# Patient Record
Sex: Female | Born: 1935 | Race: White | Hispanic: No | Smoking: Former smoker
Health system: Southern US, Community
[De-identification: ages and names within clinical notes are randomized; demographics above are authoritative.]

## PROBLEM LIST (undated history)

## (undated) DIAGNOSIS — M79609 Pain in unspecified limb: Secondary | ICD-10-CM

## (undated) DIAGNOSIS — J45909 Unspecified asthma, uncomplicated: Secondary | ICD-10-CM

## (undated) DIAGNOSIS — Z972 Presence of dental prosthetic device (complete) (partial): Secondary | ICD-10-CM

## (undated) DIAGNOSIS — M1990 Unspecified osteoarthritis, unspecified site: Secondary | ICD-10-CM

## (undated) DIAGNOSIS — R0600 Dyspnea, unspecified: Secondary | ICD-10-CM

## (undated) DIAGNOSIS — J309 Allergic rhinitis, unspecified: Secondary | ICD-10-CM

## (undated) DIAGNOSIS — S68119A Complete traumatic metacarpophalangeal amputation of unspecified finger, initial encounter: Secondary | ICD-10-CM

## (undated) DIAGNOSIS — B029 Zoster without complications: Secondary | ICD-10-CM

## (undated) DIAGNOSIS — R351 Nocturia: Secondary | ICD-10-CM

## (undated) DIAGNOSIS — H9190 Unspecified hearing loss, unspecified ear: Secondary | ICD-10-CM

## (undated) DIAGNOSIS — E785 Hyperlipidemia, unspecified: Secondary | ICD-10-CM

## (undated) DIAGNOSIS — R011 Cardiac murmur, unspecified: Secondary | ICD-10-CM

## (undated) DIAGNOSIS — G2581 Restless legs syndrome: Secondary | ICD-10-CM

## (undated) DIAGNOSIS — Z9889 Other specified postprocedural states: Secondary | ICD-10-CM

## (undated) DIAGNOSIS — C801 Malignant (primary) neoplasm, unspecified: Secondary | ICD-10-CM

## (undated) DIAGNOSIS — I00 Rheumatic fever without heart involvement: Secondary | ICD-10-CM

## (undated) DIAGNOSIS — E669 Obesity, unspecified: Secondary | ICD-10-CM

## (undated) DIAGNOSIS — F32A Depression, unspecified: Secondary | ICD-10-CM

## (undated) DIAGNOSIS — R300 Dysuria: Secondary | ICD-10-CM

## (undated) DIAGNOSIS — F419 Anxiety disorder, unspecified: Secondary | ICD-10-CM

## (undated) DIAGNOSIS — N952 Postmenopausal atrophic vaginitis: Secondary | ICD-10-CM

## (undated) DIAGNOSIS — J449 Chronic obstructive pulmonary disease, unspecified: Secondary | ICD-10-CM

## (undated) DIAGNOSIS — I10 Essential (primary) hypertension: Secondary | ICD-10-CM

## (undated) DIAGNOSIS — R350 Frequency of micturition: Secondary | ICD-10-CM

## (undated) DIAGNOSIS — R3915 Urgency of urination: Secondary | ICD-10-CM

## (undated) DIAGNOSIS — R32 Unspecified urinary incontinence: Secondary | ICD-10-CM

## (undated) DIAGNOSIS — F329 Major depressive disorder, single episode, unspecified: Secondary | ICD-10-CM

## (undated) DIAGNOSIS — R12 Heartburn: Secondary | ICD-10-CM

## (undated) DIAGNOSIS — R112 Nausea with vomiting, unspecified: Secondary | ICD-10-CM

## (undated) DIAGNOSIS — N3941 Urge incontinence: Secondary | ICD-10-CM

## (undated) DIAGNOSIS — K219 Gastro-esophageal reflux disease without esophagitis: Secondary | ICD-10-CM

## (undated) HISTORY — PX: SHOULDER SURGERY: SHX246

## (undated) HISTORY — DX: Hyperlipidemia, unspecified: E78.5

## (undated) HISTORY — DX: Allergic rhinitis, unspecified: J30.9

## (undated) HISTORY — DX: Pain in unspecified limb: M79.609

## (undated) HISTORY — DX: Dysuria: R30.0

## (undated) HISTORY — DX: Depression, unspecified: F32.A

## (undated) HISTORY — DX: Essential (primary) hypertension: I10

## (undated) HISTORY — DX: Gastro-esophageal reflux disease without esophagitis: K21.9

## (undated) HISTORY — DX: Frequency of micturition: R35.0

## (undated) HISTORY — PX: SKIN CANCER DESTRUCTION: SHX778

## (undated) HISTORY — DX: Chronic obstructive pulmonary disease, unspecified: J44.9

## (undated) HISTORY — DX: Restless legs syndrome: G25.81

## (undated) HISTORY — DX: Urge incontinence: N39.41

## (undated) HISTORY — DX: Obesity, unspecified: E66.9

## (undated) HISTORY — DX: Postmenopausal atrophic vaginitis: N95.2

## (undated) HISTORY — DX: Major depressive disorder, single episode, unspecified: F32.9

## (undated) HISTORY — DX: Unspecified urinary incontinence: R32

## (undated) HISTORY — DX: Cardiac murmur, unspecified: R01.1

## (undated) HISTORY — DX: Urgency of urination: R39.15

## (undated) HISTORY — DX: Anxiety disorder, unspecified: F41.9

## (undated) HISTORY — PX: OTHER SURGICAL HISTORY: SHX169

## (undated) HISTORY — DX: Heartburn: R12

## (undated) HISTORY — DX: Nocturia: R35.1

## (undated) HISTORY — DX: Unspecified osteoarthritis, unspecified site: M19.90

---
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
## 2001-08-28 HISTORY — PX: WRIST SURGERY: SHX841

## 2005-03-06 ENCOUNTER — Ambulatory Visit: Payer: Self-pay | Admitting: Internal Medicine

## 2005-03-13 ENCOUNTER — Ambulatory Visit: Payer: Self-pay | Admitting: Gastroenterology

## 2005-03-16 ENCOUNTER — Ambulatory Visit: Payer: Self-pay | Admitting: Gastroenterology

## 2005-03-28 ENCOUNTER — Ambulatory Visit: Payer: Self-pay | Admitting: Gastroenterology

## 2006-04-19 ENCOUNTER — Ambulatory Visit: Payer: Self-pay | Admitting: Internal Medicine

## 2007-07-29 ENCOUNTER — Ambulatory Visit: Payer: Self-pay | Admitting: Family Medicine

## 2007-10-02 ENCOUNTER — Ambulatory Visit: Payer: Self-pay | Admitting: Internal Medicine

## 2008-08-31 ENCOUNTER — Ambulatory Visit: Payer: Self-pay | Admitting: Internal Medicine

## 2008-09-14 ENCOUNTER — Ambulatory Visit: Payer: Self-pay | Admitting: Unknown Physician Specialty

## 2010-01-10 ENCOUNTER — Ambulatory Visit: Payer: Self-pay | Admitting: Unknown Physician Specialty

## 2010-02-21 ENCOUNTER — Ambulatory Visit: Payer: Self-pay | Admitting: Internal Medicine

## 2012-02-06 ENCOUNTER — Ambulatory Visit: Payer: Self-pay | Admitting: Internal Medicine

## 2013-02-12 ENCOUNTER — Ambulatory Visit: Payer: Self-pay | Admitting: Internal Medicine

## 2013-04-11 ENCOUNTER — Ambulatory Visit: Payer: Self-pay | Admitting: Unknown Physician Specialty

## 2014-01-29 DIAGNOSIS — G2581 Restless legs syndrome: Secondary | ICD-10-CM | POA: Insufficient documentation

## 2014-01-30 DIAGNOSIS — E559 Vitamin D deficiency, unspecified: Secondary | ICD-10-CM | POA: Insufficient documentation

## 2014-01-30 DIAGNOSIS — N183 Chronic kidney disease, stage 3 unspecified: Secondary | ICD-10-CM | POA: Insufficient documentation

## 2014-06-27 ENCOUNTER — Ambulatory Visit: Payer: Self-pay | Admitting: Orthopedic Surgery

## 2014-11-27 ENCOUNTER — Ambulatory Visit
Admit: 2014-11-27 | Disposition: A | Discharge status: Home / Self Care | Payer: Self-pay | Attending: Urology | Admitting: Urology

## 2014-12-19 NOTE — H&P (Signed)
Subjective/Chief Complaint Left anterior shoulder dislocation   History of Present Illness Patient is an 81 year old female who was pulled over by her dog landing onto her left side.  She fell onto an outstretched hand and had immediate pain in the left shoulder.  An anterior left shoulder dislocation was confirmed at the [facility] ER.  Closed reduction attempts in the ER were not successful.   ALLERGIES:  No Known Allergies:   HOME MEDICATIONS: Medication Instructions Status  omeprazole 20 mg oral delayed release capsule 1 cap(s) orally once a day Active  losartan 50 mg oral tablet 1 tab(s) orally once a day Active  sertraline 50 mg oral tablet 1 tab(s) orally once a day Active  rOPINIRole 0.5 mg oral tablet 1 tab(s) orally once a day (at bedtime) Active  ProAir HFA CFC free 90 mcg/inh inhalation aerosol 2 puff(s) inhaled 4 times a day Active  fluticasone nasal 50 mcg/inh nasal spray 1 spray(s) nasal once a day, As Needed   -for Shortness of Breath Active  Myrbetriq 25 mg oral tablet, extended release 1 tab(s) orally once a day Active  aspirin 81 mg oral tablet 1 tab(s) orally once a day Active  Vitamin D3 2000 intl units oral capsule 1 cap(s) orally once a day Active  Protopic 0.03% topical ointment Apply topically to affected area once a day Active   Review of Systems:  Subjective/Chief Complaint Left shoulder pain.   Physical Exam:  GEN no acute distress   HEENT red conjunctivae, PERRL, hearing intact to voice, dry oral mucosa, Oropharynx clear   NECK supple   RESP normal resp effort  clear BS  no use of accessory muscles   CARD regular rate  murmur present   ABD denies tenderness  soft  normal BS   LYMPH negative neck   SKIN normal to palpation   NEURO motor/sensory function intact   PSYCH A+O to time, place, person   Radiology Results: XRay:    31-Oct-15 18:23, DXR Shoulder Left Two View  DXR Shoulder Left Two View  REASON FOR EXAM:    fall, shoulder  pain  COMMENTS:       PROCEDURE: DXR - DXR SHOULDER LEFT TWO VIEW  - Jun 27 2014  6:23PM     CLINICAL DATA:  Fall while walking dog with shoulder pain    EXAM:  LEFT SHOULDER - 2+ VIEW    COMPARISON:  None.    FINDINGS:  There is evidence of an anterior inferior dislocation of the humeral  head with respect to the glenoid labrum. Some irregularity of the  humeral head is noted.     IMPRESSION:  Anterior inferior dislocation of the humeral head. Mild irregularity  of the humeral head is noted but incompletely evaluated on this exam      Electronically Signed    By: Inez Catalina M.D.    On: 06/27/2014 18:37         Verified By: Everlene Farrier, M.D.,    31-Oct-15 18:23, Wrist Left Complete  Wrist Left Complete  REASON FOR EXAM:    fall  COMMENTS:       PROCEDURE: DXR - DXR WRIST LT COMP WITH OBLIQUES  - Jun 27 2014  6:23PM     CLINICAL DATA:  Fall while walking dog    EXAM:  LEFT WRIST - COMPLETE 3+ VIEW    COMPARISON:  None.    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 Lucency is noted in the distal radius likely related to prior  fracture as well as underlying cystic change. No acute fracture or  dislocation is noted.     IMPRESSION:  Chronic changes without acute abnormality.      Electronically Signed    By: Inez Catalina M.D.    On: 06/27/2014 18:36         Verified By: Everlene Farrier, M.D.,  LabUnknown:    31-Oct-15 18:23, DXR Shoulder Left Two View  PACS Image    Assessment/Admission Diagnosis Left anterior shoulder dislocation   Plan Patient is being brought to the OR for a closed reduction under anesthesia.  The patient and her son in law understand and agree with this plan.   Electronic Signatures: Thornton Park (MD)  (Signed 31-Oct-15 21:38)  Authored: CHIEF COMPLAINT and HISTORY, ALLERGIES, HOME MEDICATIONS, REVIEW OF SYSTEMS, PHYSICAL EXAM, Radiology, ASSESSMENT AND PLAN   Last Updated:  31-Oct-15 21:38 by Thornton Park (MD)

## 2014-12-19 NOTE — Op Note (Signed)
PATIENT NAME:  Jody Wagner, Jody Wagner MR#:  [id number] DATE OF BIRTH:  October 09, 1930  DATE OF PROCEDURE:  06/27/2014  PREOPERATIVE DIAGNOSIS: Left anterior shoulder dislocation.   POSTOPERATIVE DIAGNOSIS: Left anterior shoulder dislocation.   PROCEDURE: Closed reduction of left anterior shoulder dislocation.   ANESTHESIA: General.   SURGEON: Timoteo Gaul, MD.   COMPLICATIONS: None.   INDICATIONS FOR PROCEDURE: The patient is an 81 year old female, who sustained a left anterior shoulder dislocation when her dog pulled her down. She landed on her left outstretched arm and had immediate pain in left shoulder. She was diagnosed with an anterior left shoulder dislocation upon presentation to the ER. Closed reduction attempts in the Emergency Room were unsuccessful. The decision was therefore made to bring the patient to the Operating Room for a closed reduction under anesthesia. I reviewed the risks and benefits of the procedure with the patient.   PROCEDURE NOTE: The patient was brought to the Operating Room. Her left shoulder had been marked by me according to the hospital's right site protocol. The patient underwent general anesthesia with propofol and succinyl choline. A timeout was performed to verify the patient's name, date of birth, medical record number, correct site of surgery and correct procedure to be performed. Once all in attendance, were in agreement, the case began. The patient had gentle traction applied to the left shoulder in abduction with gentle internal and external rotation. The patient's left arm was then brought down by her side. Intraoperative C-arm images were taken which confirms a successful closed reduction. The patient was then awakened and brought to the PACU in stable condition. Her left arm was placed in a sling. I was present for the entire case.    ____________________________ Timoteo Gaul, MD klk:sac D: 06/27/2014 22:55:50 ET T: 06/28/2014 09:34:32  ET JOB#: [id number]  cc: Timoteo Gaul, MD, <Dictator> Timoteo Gaul MD ELECTRONICALLY SIGNED 07/15/2014 8:06

## 2015-02-01 ENCOUNTER — Other Ambulatory Visit: Payer: Self-pay | Admitting: Internal Medicine

## 2015-02-01 DIAGNOSIS — Z1231 Encounter for screening mammogram for malignant neoplasm of breast: Secondary | ICD-10-CM

## 2015-02-25 ENCOUNTER — Ambulatory Visit: Payer: Medicare Other

## 2015-03-03 ENCOUNTER — Ambulatory Visit
Admission: RE | Admit: 2015-03-03 | Discharge: 2015-03-03 | Disposition: A | Discharge status: Home / Self Care | Payer: Medicare Other | Source: Ambulatory Visit | Attending: Internal Medicine | Admitting: Internal Medicine

## 2015-03-03 DIAGNOSIS — Z1231 Encounter for screening mammogram for malignant neoplasm of breast: Secondary | ICD-10-CM | POA: Insufficient documentation

## 2015-05-27 ENCOUNTER — Other Ambulatory Visit: Payer: Self-pay | Admitting: Urology

## 2015-08-03 DIAGNOSIS — F330 Major depressive disorder, recurrent, mild: Secondary | ICD-10-CM | POA: Insufficient documentation

## 2015-08-08 DIAGNOSIS — E663 Overweight: Secondary | ICD-10-CM | POA: Insufficient documentation

## 2015-08-29 HISTORY — PX: OTHER SURGICAL HISTORY: SHX169

## 2015-11-05 ENCOUNTER — Telehealth: Payer: Self-pay | Admitting: Urology

## 2015-11-05 NOTE — Telephone Encounter (Signed)
Pt wants to know if she needs to come back in for a follow up appt.  Please advise.

## 2015-11-07 NOTE — Telephone Encounter (Signed)
Yes.  She needs a yearly appointment.

## 2015-11-17 ENCOUNTER — Encounter: Payer: Self-pay | Admitting: *Deleted

## 2015-11-22 ENCOUNTER — Encounter: Payer: Self-pay | Admitting: Urology

## 2015-11-22 ENCOUNTER — Ambulatory Visit (INDEPENDENT_AMBULATORY_CARE_PROVIDER_SITE_OTHER): Payer: Medicare Other | Admitting: Urology

## 2015-11-22 VITALS — BP 142/82 | HR 69 | Ht 61.0 in | Wt 150.0 lb

## 2015-11-22 DIAGNOSIS — N952 Postmenopausal atrophic vaginitis: Secondary | ICD-10-CM | POA: Diagnosis not present

## 2015-11-22 DIAGNOSIS — N3941 Urge incontinence: Secondary | ICD-10-CM | POA: Diagnosis not present

## 2015-11-22 LAB — BLADDER SCAN AMB NON-IMAGING: SCAN RESULT: 30

## 2015-11-22 MED ORDER — MIRABEGRON ER 25 MG PO TB24: ORAL_TABLET | ORAL | Status: DC

## 2015-11-22 NOTE — Progress Notes (Signed)
11/22/2015 11:04 AM   Jody Wagner 10/28/1930 [id number]  Referring provider: Ezequiel Kayser, MD [facility], Jody Wagner [id number]  Chief Complaint  Patient presents with  . Urinary Incontinence    1 year follow up  . Vaginitis    HPI: Patient is an 81 year old Caucasian female with urge incontinence and atrophic vaginitis who presents today for 1 year follow-up.  Patient has had good control her urge incontinence with the Myrbetriq 25 mg daily.  She still experiences incontinence, but it is very mild.  She would like to continue the medication.  She is not had dysuria, gross hematuria or suprapubic pain.  Denies any fevers, chills, nausea or vomiting.  She has not had any recent urinary tract infections.  Her baseline urinary symptoms consist of frequency, urgency, nocturia and leakage.  Her PVR today's 30 mL.  She also has a history of atrophic vaginitis and has been applying the vaginal estrogen cream 3 nights weekly.  She has not experienced vaginal irritation or rash.     PMH: Past Medical History  Diagnosis Date  . HLD (hyperlipidemia)   . COPD (chronic obstructive pulmonary disease) ([place])   . Depression   . Allergic rhinitis   . Urinary urgency   . Nocturia   . Urinary frequency   . Dysuria   . RLS (restless legs syndrome)   . HTN (hypertension)   . Heartburn   . Acid reflux   . Anxiety   . Arthritis   . Heart murmur   . Obesity   . Popliteal pain   . Atrophic vaginitis   . Urge incontinence   . Urinary incontinence     Surgical History: Past Surgical History  Procedure Laterality Date  . Arm surgery Left 2015  . Wrist surgery Left 2003  . Rheumatic fever  1953    damaged valves    Home Medications:    Medication List       This list is accurate as of: 11/22/15 11:04 AM.  Always use your most recent med list.               aspirin EC 81 MG tablet  Take by mouth.     cetirizine 10 MG tablet  Commonly  known as:  ZYRTEC  Take by mouth. Reported on 11/22/2015     fluticasone 50 MCG/ACT nasal spray  Commonly known as:  FLONASE  Place into the nose.     losartan 100 MG tablet  Commonly known as:  COZAAR  Take by mouth.     mirabegron ER 25 MG Tb24 tablet  Commonly known as:  MYRBETRIQ  TAKE (1) TABLET BY MOUTH EVERY DAY     omeprazole 20 MG capsule  Commonly known as:  PRILOSEC  Take by mouth.     OPTIVE 0.5-0.9 % Soln  Generic drug:  Carboxymethylcellul-Glycerin  Apply to eye.     PROAIR HFA 108 (90 Base) MCG/ACT inhaler  Generic drug:  albuterol  Inhale into the lungs.     REQUIP 0.5 MG tablet  Generic drug:  rOPINIRole  Take by mouth.     sertraline 50 MG tablet  Commonly known as:  ZOLOFT  Take by mouth.     TYLENOL 500 MG tablet  Generic drug:  acetaminophen  Take by mouth.     Vitamin D3 2000 units capsule  Take by mouth.        Allergies:  Allergies  Allergen Reactions  .  Lovastatin Other (See Comments)    Family History: Family History  Problem Relation Age of Onset  . Heart disease    . Prostate cancer Father   . Prostate cancer Brother     Social History:  reports that she has quit smoking. She does not have any smokeless tobacco history on file. She reports that she does not drink alcohol or use illicit drugs.  ROS: UROLOGY Frequent Urination?: Yes Hard to postpone urination?: Yes Burning/pain with urination?: No Get up at night to urinate?: Yes Leakage of urine?: Yes Urine stream starts and stops?: No Trouble starting stream?: No Do you have to strain to urinate?: No Blood in urine?: No Urinary tract infection?: No Sexually transmitted disease?: No Injury to kidneys or bladder?: No Painful intercourse?: No Weak stream?: No Currently pregnant?: No Vaginal bleeding?: No Last menstrual period?: n  Gastrointestinal Nausea?: No Vomiting?: No Indigestion/heartburn?: Yes Diarrhea?: No Constipation?: No  Constitutional Fever:  No Night sweats?: No Weight loss?: No Fatigue?: Yes  Skin Skin rash/lesions?: No Itching?: No  Eyes Blurred vision?: No Double vision?: No  Ears/Nose/Throat Sore throat?: No Sinus problems?: Yes  Hematologic/Lymphatic Swollen glands?: No Easy bruising?: No  Cardiovascular Leg swelling?: No Chest pain?: No  Respiratory Cough?: No Shortness of breath?: Yes  Endocrine Excessive thirst?: No  Musculoskeletal Back pain?: No Joint pain?: Yes  Neurological Headaches?: No Dizziness?: No  Psychologic Depression?: No Anxiety?: No  Physical Exam: BP 142/82 mmHg  Pulse 69  Ht 5\' 1"  (1.549 m)  Wt 150 lb (68.04 kg)  BMI 28.36 kg/m2  Constitutional: Well nourished. Alert and oriented, No acute distress. HEENT: [place] AT, moist mucus membranes. Trachea midline, no masses. Cardiovascular: No clubbing, cyanosis, or edema. Respiratory: Normal respiratory effort, no increased work of breathing. GI: Abdomen is soft, non tender, non distended, no abdominal masses. Liver and spleen not palpable.  No hernias appreciated.  Stool sample for occult testing is not indicated.   GU: No CVA tenderness.  No bladder fullness or masses.   Skin: No rashes, bruises or suspicious lesions. Lymph: No cervical or inguinal adenopathy. Neurologic: Grossly intact, no focal deficits, moving all 4 extremities. Psychiatric: Normal mood and affect.  Laboratory Data:  Pertinent Imaging: Results for SHINE, ASSELIN (MRN [id number]) as of 11/23/2015 15:17  Ref. Range 11/22/2015 10:52  Scan Result Unknown 30    Assessment & Plan:    1. Urge incontinence:   Patient's urge incontinence is well controlled with her Myrbetriq 25 mg daily.  PVR is minimal.  Central refill to her pharmacy for the medication.  She will follow-up in one year for PVR, exam and symptom recheck.  - BLADDER SCAN AMB NON-IMAGING  2. Atrophic vaginitis:   Patient is having good effect with the vaginal estrogen cream.  She will  continue to apply it Monday nights, Wednesday nights and Friday nights.  I have given her samples of the estrogen cream.  She has limited coverage for the cream with her insurance, so I have encouraged her to call the office from time to time to see if we have samples available.  She'll follow-up in one year for an exam and symptom recheck.   Return in about 1 year (around 11/21/2016) for PVR and exam.  These notes generated with voice recognition software. I apologize for typographical errors.  Zara Council, [facility] [address] [place], [place] [address] [phone]

## 2015-11-24 DIAGNOSIS — N952 Postmenopausal atrophic vaginitis: Secondary | ICD-10-CM | POA: Insufficient documentation

## 2015-11-24 DIAGNOSIS — N3941 Urge incontinence: Secondary | ICD-10-CM | POA: Insufficient documentation

## 2015-11-26 ENCOUNTER — Telehealth: Payer: Self-pay | Admitting: Urology

## 2015-11-26 NOTE — Telephone Encounter (Signed)
Pt uses [facility] in [place].  She went to pick up Premarin and they told her they sent [place] pre-authorization and never heard back from [place].  Please call pt and let her know if this was received and if they rx can be sent in.  [phone]

## 2015-11-30 NOTE — Telephone Encounter (Signed)
Spoke with pt in reference to Premarin cream and PA. Made aware insurance co denied claim. Pt voiced understanding.

## 2016-02-15 ENCOUNTER — Other Ambulatory Visit: Payer: Self-pay | Admitting: Nurse Practitioner

## 2016-02-15 DIAGNOSIS — R1032 Left lower quadrant pain: Secondary | ICD-10-CM

## 2016-02-17 ENCOUNTER — Ambulatory Visit
Admission: RE | Admit: 2016-02-17 | Discharge: 2016-02-17 | Disposition: A | Discharge status: Home / Self Care | Payer: Medicare Other | Source: Ambulatory Visit | Attending: Nurse Practitioner | Admitting: Nurse Practitioner

## 2016-02-17 DIAGNOSIS — I700 Atherosclerosis of aorta: Secondary | ICD-10-CM | POA: Diagnosis not present

## 2016-02-17 DIAGNOSIS — J439 Emphysema, unspecified: Secondary | ICD-10-CM | POA: Insufficient documentation

## 2016-02-17 DIAGNOSIS — R1032 Left lower quadrant pain: Secondary | ICD-10-CM | POA: Diagnosis present

## 2016-02-17 MED ORDER — IOPAMIDOL (ISOVUE-300) INJECTION 61%
100.0000 mL | Freq: Once | INTRAVENOUS | Status: AC | PRN
  Administered 2016-02-17: 80 mL via INTRAVENOUS

## 2016-02-24 ENCOUNTER — Other Ambulatory Visit: Payer: Self-pay | Admitting: Urology

## 2016-03-16 ENCOUNTER — Other Ambulatory Visit: Payer: Self-pay | Admitting: Nurse Practitioner

## 2016-03-16 DIAGNOSIS — R1310 Dysphagia, unspecified: Secondary | ICD-10-CM

## 2016-03-21 ENCOUNTER — Ambulatory Visit
Admission: RE | Admit: 2016-03-21 | Discharge: 2016-03-21 | Disposition: A | Discharge status: Home / Self Care | Payer: Medicare Other | Source: Ambulatory Visit | Attending: Nurse Practitioner | Admitting: Nurse Practitioner

## 2016-03-21 DIAGNOSIS — R1310 Dysphagia, unspecified: Secondary | ICD-10-CM

## 2016-03-21 DIAGNOSIS — K219 Gastro-esophageal reflux disease without esophagitis: Secondary | ICD-10-CM | POA: Diagnosis present

## 2016-03-21 DIAGNOSIS — R1319 Other dysphagia: Secondary | ICD-10-CM | POA: Diagnosis present

## 2016-07-24 ENCOUNTER — Other Ambulatory Visit (INDEPENDENT_AMBULATORY_CARE_PROVIDER_SITE_OTHER): Payer: Self-pay | Admitting: Vascular Surgery

## 2016-07-24 DIAGNOSIS — I6523 Occlusion and stenosis of bilateral carotid arteries: Secondary | ICD-10-CM

## 2016-08-07 ENCOUNTER — Ambulatory Visit (INDEPENDENT_AMBULATORY_CARE_PROVIDER_SITE_OTHER): Payer: Self-pay | Admitting: Vascular Surgery

## 2016-08-07 ENCOUNTER — Encounter (INDEPENDENT_AMBULATORY_CARE_PROVIDER_SITE_OTHER): Payer: Self-pay

## 2016-10-12 ENCOUNTER — Ambulatory Visit (INDEPENDENT_AMBULATORY_CARE_PROVIDER_SITE_OTHER): Payer: Medicare Other | Admitting: Vascular Surgery

## 2016-10-12 ENCOUNTER — Ambulatory Visit (INDEPENDENT_AMBULATORY_CARE_PROVIDER_SITE_OTHER): Payer: Medicare Other

## 2016-10-12 ENCOUNTER — Encounter (INDEPENDENT_AMBULATORY_CARE_PROVIDER_SITE_OTHER): Payer: Self-pay | Admitting: Vascular Surgery

## 2016-10-12 DIAGNOSIS — I6523 Occlusion and stenosis of bilateral carotid arteries: Secondary | ICD-10-CM | POA: Diagnosis not present

## 2016-10-12 DIAGNOSIS — I10 Essential (primary) hypertension: Secondary | ICD-10-CM | POA: Insufficient documentation

## 2016-10-12 DIAGNOSIS — J439 Emphysema, unspecified: Secondary | ICD-10-CM | POA: Diagnosis not present

## 2016-10-12 DIAGNOSIS — J449 Chronic obstructive pulmonary disease, unspecified: Secondary | ICD-10-CM | POA: Insufficient documentation

## 2016-10-12 DIAGNOSIS — I6522 Occlusion and stenosis of left carotid artery: Secondary | ICD-10-CM

## 2016-10-12 DIAGNOSIS — I6529 Occlusion and stenosis of unspecified carotid artery: Secondary | ICD-10-CM | POA: Insufficient documentation

## 2016-10-12 LAB — VAS US CAROTID
LCCADDIAS: -18 cm/s
LCCADSYS: -103 cm/s
LICADDIAS: -21 cm/s
LICADSYS: -91 cm/s
LICAPDIAS: 31 cm/s
LICAPSYS: 144 cm/s
Left CCA prox dias: 12 cm/s
Left CCA prox sys: 94 cm/s
RIGHT CCA MID DIAS: 17 cm/s
RIGHT ECA DIAS: -5 cm/s
Right CCA prox dias: -20 cm/s
Right CCA prox sys: -109 cm/s
Right cca dist sys: -87 cm/s

## 2016-10-12 NOTE — Progress Notes (Signed)
MRN : [id number]  Jody Wagner is a 83 y.o. (May 22, 1931) female who presents with chief complaint of  Chief Complaint  Patient presents with  . Follow-up  .  History of Present Illness:The patient is seen for follow up evaluation of carotid stenosis. The carotid stenosis followed by ultrasound.   The patient denies amaurosis fugax. There is no recent history of TIA symptoms or focal motor deficits. There is no prior documented CVA.  The patient is taking enteric-coated aspirin 81 mg daily.  There is no history of migraine headaches. There is no history of seizures.  The patient has a history of coronary artery disease, no recent episodes of angina or shortness of breath. The patient denies PAD or claudication symptoms. There is a history of hyperlipidemia which is being treated with a statin.    Carotid Duplex done today shows <50% bilateral internal carotid stenosis.    Current Meds  Medication Sig  . acetaminophen (TYLENOL) 500 MG tablet Take by mouth.  Marland Kitchen aspirin EC 81 MG tablet Take by mouth.  . cetirizine (ZYRTEC) 10 MG tablet Take by mouth. Reported on 11/22/2015  . Cholecalciferol (VITAMIN D3) 2000 units capsule Take by mouth.  . fluticasone (FLONASE) 50 MCG/ACT nasal spray Place into the nose.  Marland Kitchen MYRBETRIQ 25 MG TB24 tablet TAKE (1) TABLET BY MOUTH EVERY DAY  . omeprazole (PRILOSEC) 20 MG capsule Take by mouth.  Vladimir Faster Glycol-Propyl Glycol (SYSTANE) 0.4-0.3 % GEL ophthalmic gel Place 1 application into both eyes as needed.  Marland Kitchen Propylene Glycol (SYSTANE BALANCE OP) Apply to eye as needed.  Marland Kitchen rOPINIRole (REQUIP) 0.5 MG tablet Take by mouth.  . sertraline (ZOLOFT) 50 MG tablet Take by mouth.    Past Medical History:  Diagnosis Date  . Acid reflux   . Allergic rhinitis   . Anxiety   . Arthritis   . Atrophic vaginitis   . COPD (chronic obstructive pulmonary disease) ([place])   . Depression   . Dysuria   . Heart murmur   . Heartburn   . HLD (hyperlipidemia)   .  HTN (hypertension)   . Nocturia   . Obesity   . Popliteal pain   . RLS (restless legs syndrome)   . Urge incontinence   . Urinary frequency   . Urinary incontinence   . Urinary urgency     Past Surgical History:  Procedure Laterality Date  . arm surgery Left 2015  . rheumatic fever  1953   damaged valves  . WRIST SURGERY Left 2003    Social History Social History  Substance Use Topics  . Smoking status: Former Research scientist (life sciences)  . Smokeless tobacco: Never Used  . Alcohol use No    Family History Family History  Problem Relation Age of Onset  . Heart disease    . Prostate cancer Father   . Prostate cancer Brother   No family history of bleeding/clotting disorders, porphyria or autoimmune disease  Allergies  Allergen Reactions  . Lovastatin Other (See Comments)     REVIEW OF SYSTEMS (Negative unless checked)  Constitutional: [] Weight loss  [] Fever  [] Chills Cardiac: [] Chest pain   [] Chest pressure   [] Palpitations   [] Shortness of breath when laying flat   [] Shortness of breath with exertion. Vascular:  [] Pain in legs with walking   [] Pain in legs at rest  [] History of DVT   [] Phlebitis   [] Swelling in legs   [] Varicose veins   [] Non-healing ulcers Pulmonary:   [] Uses home oxygen   []   Productive cough   [] Hemoptysis   [] Wheeze  [] COPD   [] Asthma Neurologic:  [] Dizziness   [] Seizures   [] History of stroke   [] History of TIA  [] Aphasia   [] Vissual changes   [] Weakness or numbness in arm   [] Weakness or numbness in leg Musculoskeletal:   [] Joint swelling   [] Joint pain   [] Low back pain Hematologic:  [] Easy bruising  [] Easy bleeding   [] Hypercoagulable state   [] Anemic Gastrointestinal:  [] Diarrhea   [] Vomiting  [] Gastroesophageal reflux/heartburn   [] Difficulty swallowing. Genitourinary:  [] Chronic kidney disease   [] Difficult urination  [] Frequent urination   [] Blood in urine Skin:  [] Rashes   [] Ulcers  Psychological:  [] History of anxiety   []  History of major  depression.  Physical Examination  Vitals:   10/12/16 1136  BP: 137/70  Pulse: 79  Resp: 16  Weight: 147 lb (66.7 kg)   Body mass index is 27.78 kg/m. Gen: WD/WN, NAD Head: [place]/AT, No temporalis wasting.  Ear/Nose/Throat: Hearing grossly intact, nares w/o erythema or drainage, poor dentition Eyes: PER, EOMI, sclera nonicteric.  Neck: Supple, no masses.  No bruit or JVD.  Pulmonary:  Good air movement, clear to auscultation bilaterally, no use of accessory muscles.  Cardiac: RRR, normal S1, S2, no Murmurs. Vascular:   Vessel Right Left  Radial Palpable Palpable  Ulnar Palpable Palpable  Brachial Palpable Palpable  Carotid Palpable Palpable  Femoral Palpable Palpable  Popliteal Palpable Palpable  PT Palpable Palpable  DP Palpable Palpable   Gastrointestinal: soft, non-distended. No guarding/no peritoneal signs.  Musculoskeletal: M/S 5/5 throughout.  No deformity or atrophy.  Neurologic: CN 2-12 intact. Pain and light touch intact in extremities.  Symmetrical.  Speech is fluent. Motor exam as listed above. Psychiatric: Judgment intact, Mood & affect appropriate for pt's clinical situation. Dermatologic: No rashes or ulcers noted.  No changes consistent with cellulitis. Lymph : No Cervical lymphadenopathy, no lichenification or skin changes of chronic lymphedema.  CBC No results found for: WBC, HGB, HCT, MCV, PLT  BMET No results found for: NA, K, CL, CO2, GLUCOSE, BUN, CREATININE, CALCIUM, GFRNONAA, GFRAA CrCl cannot be calculated (No order found.).  COAG No results found for: INR, PROTIME  Radiology No results found.   Assessment/Plan 1. Stenosis of left carotid artery Recommend:  Given the patient's asymptomatic subcritical stenosis no further invasive testing or surgery at this time.  Duplex ultrasound shows <50% stenosis bilaterally.  Continue antiplatelet therapy as prescribed Continue management of CAD, HTN and Hyperlipidemia Healthy heart diet,   encouraged exercise at least 4 times per week Follow up in 12 months with duplex ultrasound and physical exam based on <50% stenosis of the bilateral carotid artery   - VAS US CAROTID; Future  2. Essential hypertension Continue antihypertensive medications as already ordered, these medications have been reviewed and there are no changes at this time.   3. Pulmonary emphysema, unspecified emphysema type ([place]) Continue pulmonary medications and aerosols as already ordered, these medications have been reviewed and there are no changes at this time.   Hortencia Pilar, MD  10/12/2016 1:47 PM

## 2016-11-20 NOTE — Progress Notes (Signed)
11/21/2016 10:37 AM   Jody Wagner [birth year] [id number]  Referring provider: Ezequiel Kayser, MD [name] [name], [name] [address]  Chief Complaint  Patient presents with  . Urinary Incontinence    1 year follow up   . Vaginitis    HPI: Patient is an 83 year old Caucasian female with urge incontinence and atrophic vaginitis who presents today for 1 year follow-up.  Urge incontinence Patient has had good control her urge incontinence with the Myrbetriq 25 mg daily.  She is experiencing urgency x 4-7, frequency x 4-7, limiting fluids to reduce bathroom trips, engages in toilet mapping, incontinence x 4-7 and nocturia x 0-3.    She would like to increase the medication.  She is not had dysuria, gross hematuria or suprapubic pain.  Denies any fevers, chills, nausea or vomiting.  She has not had any recent urinary tract infections.  She does drink  6 cups of coffee daily.  She does not drink water.  Her PVR today is 88 mL.  Vaginal atrophy She is not using the vaginal estrogen cream as it is cost prohibitive and she has transportation issues and cannot come to the office for samples.    PMH: Past Medical History:  Diagnosis Date  . Acid reflux   . Allergic rhinitis   . Anxiety   . Arthritis   . Atrophic vaginitis   . COPD (chronic obstructive pulmonary disease) ([name])   . Depression   . Dysuria   . Heart murmur   . Heartburn   . HLD (hyperlipidemia)   . HTN (hypertension)   . Nocturia   . Obesity   . Popliteal pain   . RLS (restless legs syndrome)   . Urge incontinence   . Urinary frequency   . Urinary incontinence   . Urinary urgency     Surgical History: Past Surgical History:  Procedure Laterality Date  . arm surgery Left 2015  . rheumatic fever  1953   damaged valves  . WRIST SURGERY Left 2003    Home Medications:  Allergies as of 11/21/2016      Reactions   Other Swelling   Vomit   "Wild Mushrooms"   Lovastatin Other (See  Comments)   Latex Itching      Medication List       Accurate as of 11/21/16 10:37 AM. Always use your most recent med list.          aspirin EC 81 MG tablet Take by mouth.   cetirizine 10 MG tablet Commonly known as:  ZYRTEC Take by mouth. Reported on 11/22/2015   fluticasone 50 MCG/ACT nasal spray Commonly known as:  FLONASE Place into the nose.   losartan 100 MG tablet Commonly known as:  COZAAR Take 100 mg by mouth.   losartan 100 MG tablet Commonly known as:  COZAAR Take by mouth.   MYRBETRIQ 25 MG Tb24 tablet Generic drug:  mirabegron ER TAKE (1) TABLET BY MOUTH EVERY DAY   omeprazole 20 MG capsule Commonly known as:  PRILOSEC Take by mouth.   OPTIVE 0.5-0.9 % Soln Generic drug:  Carboxymethylcellul-Glycerin Apply to eye.   albuterol 108 (90 Base) MCG/ACT inhaler Commonly known as:  PROVENTIL HFA;VENTOLIN HFA Inhale into the lungs.   PROAIR HFA 108 (90 Base) MCG/ACT inhaler Generic drug:  albuterol Inhale into the lungs.   REFRESH PLUS 0.5 % Soln Generic drug:  Carboxymethylcellulose Sod PF 1 drop Three (3) times a day as needed.  REQUIP 0.5 MG tablet Generic drug:  rOPINIRole Take by mouth.   sertraline 50 MG tablet Commonly known as:  ZOLOFT Take by mouth.   SYSTANE 0.4-0.3 % Gel ophthalmic gel Generic drug:  Polyethyl Glycol-Propyl Glycol Place 1 application into both eyes as needed.   SYSTANE BALANCE OP Apply to eye as needed.   TYLENOL 500 MG tablet Generic drug:  acetaminophen Take by mouth.   Vitamin D3 2000 units capsule Take by mouth.       Allergies:  Allergies  Allergen Reactions  . Other Swelling    Vomit   "Wild Mushrooms"  . Lovastatin Other (See Comments)  . Latex Itching    Family History: Family History  Problem Relation Age of Onset  . Heart disease    . Prostate cancer Father   . Prostate cancer Brother   . Kidney cancer Neg Hx   . Bladder Cancer Neg Hx   . Kidney disease Neg Hx     Social  History:  reports that she has quit smoking. Her smoking use included Cigarettes. She has never used smokeless tobacco. She reports that she does not drink alcohol or use drugs.  ROS: UROLOGY Frequent Urination?: No Hard to postpone urination?: Yes Burning/pain with urination?: No Get up at night to urinate?: Yes Leakage of urine?: Yes Urine stream starts and stops?: Yes Trouble starting stream?: No Do you have to strain to urinate?: No Blood in urine?: No Urinary tract infection?: No Sexually transmitted disease?: No Injury to kidneys or bladder?: No Painful intercourse?: No Weak stream?: No Currently pregnant?: No Vaginal bleeding?: No Last menstrual period?: n  Gastrointestinal Nausea?: No Vomiting?: No Indigestion/heartburn?: Yes Diarrhea?: No Constipation?: No  Constitutional Fever: No Night sweats?: No Weight loss?: No Fatigue?: No  Skin Skin rash/lesions?: No Itching?: No  Eyes Blurred vision?: No Double vision?: No  Ears/Nose/Throat Sore throat?: No Sinus problems?: No  Hematologic/Lymphatic Swollen glands?: No Easy bruising?: Yes  Cardiovascular Leg swelling?: No Chest pain?: No  Respiratory Cough?: No Shortness of breath?: Yes  Endocrine Excessive thirst?: No  Musculoskeletal Back pain?: No Joint pain?: No  Neurological Headaches?: No Dizziness?: No  Psychologic Depression?: No Anxiety?: No  Physical Exam: BP (!) 155/80   Pulse 67   Ht 4\' 11"  (1.499 m)   Wt 145 lb 9.6 oz (66 kg)   BMI 29.41 kg/m   Constitutional: Well nourished. Alert and oriented, No acute distress. HEENT: [place] AT, moist mucus membranes. Trachea midline, no masses. Cardiovascular: No clubbing, cyanosis, or edema. Respiratory: Normal respiratory effort, no increased work of breathing. GI: Abdomen is soft, non tender, non distended, no abdominal masses. Liver and spleen not palpable.  No hernias appreciated.  Stool sample for occult testing is not  indicated.   GU: No CVA tenderness.  No bladder fullness or masses.   Skin: No rashes, bruises or suspicious lesions. Lymph: No cervical or inguinal adenopathy. Neurologic: Grossly intact, no focal deficits, moving all 4 extremities. Psychiatric: Normal mood and affect.    Pertinent Imaging: Results for Jody, Wagner (MRN [id number]) as of 11/21/2016 10:30  Ref. Range 11/21/2016 10:18  Scan Result Unknown 88     Assessment & Plan:    1. Urge incontinence  - STRONGLY encouraged the patient to decrease her coffee intake  - STRONGLY  Encouraged the patient to increase her water intake  - Patient's urge incontinence is no longer well controlled with her Myrbetriq 25 mg daily.  PVR is minimal.  She would like  to try the Myrbetriq 50 mg daily.  She will follow-up in 3 weeks for PVR and OAB questionnaire.    - BLADDER SCAN AMB NON-IMAGING  2. Atrophic vaginitis  - advised patient to use coconut/olive oil for vaginal discomfort  - had a sample of Premarin for her today  - RTC in one year  Return in about 3 weeks (around 12/12/2016) for PVR and OAB questionnaire.  These notes generated with voice recognition software. I apologize for typographical errors.  Zara Council, [facility] [address], [place], [place] [address] [phone]

## 2016-11-21 ENCOUNTER — Encounter: Payer: Self-pay | Admitting: Urology

## 2016-11-21 ENCOUNTER — Ambulatory Visit (INDEPENDENT_AMBULATORY_CARE_PROVIDER_SITE_OTHER): Payer: Medicare Other | Admitting: Urology

## 2016-11-21 VITALS — BP 155/80 | HR 67 | Ht 59.0 in | Wt 145.6 lb

## 2016-11-21 DIAGNOSIS — I6523 Occlusion and stenosis of bilateral carotid arteries: Secondary | ICD-10-CM

## 2016-11-21 DIAGNOSIS — N3941 Urge incontinence: Secondary | ICD-10-CM | POA: Diagnosis not present

## 2016-11-21 DIAGNOSIS — N952 Postmenopausal atrophic vaginitis: Secondary | ICD-10-CM | POA: Diagnosis not present

## 2016-11-21 LAB — BLADDER SCAN AMB NON-IMAGING: SCAN RESULT: 88

## 2016-12-11 NOTE — Progress Notes (Signed)
12/12/2016 11:17 AM   Jody Wagner Jun 06, 1931 [id number]  Referring provider: Ezequiel Kayser, MD [place] [place], [place] [address]  Chief Complaint  Patient presents with  . Urinary Incontinence    Urge   3 week follow up     HPI: Patient is an 84 year old Caucasian female with urge incontinence and atrophic vaginitis who presents today for 3 weeks follow up after increasing her Myrbetriq from 25 mg to 50 mg daily.    Urge incontinence Patient has had good control her urge incontinence with the Myrbetriq 25 mg daily, but she wanted to increase the medication to 50 mg daily.   She is experiencing urgency x 0-3 (improvement), frequency x 4-7 (stable), still limiting fluids to reduce bathroom trips, still engages in toilet mapping, incontinence x 0-3 (improvement) and nocturia x 0-3 (stable).  She is not had dysuria, gross hematuria or suprapubic pain.  Denies any fevers, chills, nausea or vomiting.  She has not had any recent urinary tract infections.  She does drink  6 cups of coffee daily.  She does not drink water.  Her PVR today is 150 mL.  Vaginal atrophy She is not using the vaginal estrogen cream as it is cost prohibitive and she has transportation issues and cannot come to the office for samples.  She is currently using the samples I gave her at the last visit.    PMH: Past Medical History:  Diagnosis Date  . Acid reflux   . Allergic rhinitis   . Anxiety   . Arthritis   . Atrophic vaginitis   . COPD (chronic obstructive pulmonary disease) ([place])   . Depression   . Dysuria   . Heart murmur   . Heartburn   . HLD (hyperlipidemia)   . HTN (hypertension)   . Nocturia   . Obesity   . Popliteal pain   . RLS (restless legs syndrome)   . Urge incontinence   . Urinary frequency   . Urinary incontinence   . Urinary urgency     Surgical History: Past Surgical History:  Procedure Laterality Date  . arm surgery Left 2015  . rheumatic fever   1953   damaged valves  . skin cancer removal  08/2015   squamous cell  . WRIST SURGERY Left 2003    Home Medications:  Allergies as of 12/12/2016      Reactions   Other Swelling   Vomit   "Wild Mushrooms"   Lovastatin Other (See Comments)   Latex Itching      Medication List       Accurate as of 12/12/16 11:17 AM. Always use your most recent med list.          aspirin EC 81 MG tablet Take by mouth.   cetirizine 10 MG tablet Commonly known as:  ZYRTEC Take by mouth. Reported on 11/22/2015   fluticasone 50 MCG/ACT nasal spray Commonly known as:  FLONASE Place into the nose.   losartan 100 MG tablet Commonly known as:  COZAAR Take 100 mg by mouth.   losartan 100 MG tablet Commonly known as:  COZAAR Take by mouth.   mirabegron ER 50 MG Tb24 tablet Commonly known as:  MYRBETRIQ Take 1 tablet (50 mg total) by mouth daily.   omeprazole 20 MG capsule Commonly known as:  PRILOSEC Take by mouth.   OPTIVE 0.5-0.9 % Soln Generic drug:  Carboxymethylcellul-Glycerin Apply to eye.   albuterol 108 (90 Base) MCG/ACT inhaler Commonly  known as:  PROVENTIL HFA;VENTOLIN HFA Inhale into the lungs.   PROAIR HFA 108 (90 Base) MCG/ACT inhaler Generic drug:  albuterol Inhale into the lungs.   REFRESH PLUS 0.5 % Soln Generic drug:  Carboxymethylcellulose Sod PF 1 drop Three (3) times a day as needed.   REQUIP 0.5 MG tablet Generic drug:  rOPINIRole Take by mouth.   sertraline 50 MG tablet Commonly known as:  ZOLOFT Take by mouth.   SYSTANE 0.4-0.3 % Gel ophthalmic gel Generic drug:  Polyethyl Glycol-Propyl Glycol Place 1 application into both eyes as needed.   SYSTANE BALANCE OP Apply to eye as needed.   TYLENOL 500 MG tablet Generic drug:  acetaminophen Take by mouth.   Vitamin D3 2000 units capsule Take by mouth.       Allergies:  Allergies  Allergen Reactions  . Other Swelling    Vomit   "Wild Mushrooms"  . Lovastatin Other (See Comments)  .  Latex Itching    Family History: Family History  Problem Relation Age of Onset  . Heart disease    . Prostate cancer Father   . Prostate cancer Brother   . Lung cancer Sister   . Cancer Sister     intestines  . Kidney cancer Neg Hx   . Bladder Cancer Neg Hx   . Kidney disease Neg Hx     Social History:  reports that she has quit smoking. Her smoking use included Cigarettes. She has never used smokeless tobacco. She reports that she does not drink alcohol or use drugs.  ROS: UROLOGY Frequent Urination?: No Hard to postpone urination?: Yes Burning/pain with urination?: No Get up at night to urinate?: Yes Leakage of urine?: No Urine stream starts and stops?: No Trouble starting stream?: No Do you have to strain to urinate?: No Blood in urine?: No Urinary tract infection?: No Sexually transmitted disease?: No Injury to kidneys or bladder?: No Painful intercourse?: No Weak stream?: No Currently pregnant?: No Vaginal bleeding?: No Last menstrual period?: n  Gastrointestinal Nausea?: No Vomiting?: No Indigestion/heartburn?: No Diarrhea?: No Constipation?: No  Constitutional Fever: No Night sweats?: No Weight loss?: No Fatigue?: Yes  Skin Skin rash/lesions?: No Itching?: No  Eyes Blurred vision?: No Double vision?: No  Ears/Nose/Throat Sore throat?: No Sinus problems?: Yes  Hematologic/Lymphatic Swollen glands?: No Easy bruising?: Yes  Cardiovascular Leg swelling?: Yes Chest pain?: No  Respiratory Cough?: Yes Shortness of breath?: Yes  Endocrine Excessive thirst?: No  Musculoskeletal Back pain?: No Joint pain?: No  Neurological Headaches?: No Dizziness?: No  Psychologic Depression?: No Anxiety?: No  Physical Exam: BP (!) 151/76   Pulse 90   Ht 4\' 11"  (1.499 m)   Wt 143 lb 9.6 oz (65.1 kg)   BMI 29.00 kg/m   Constitutional: Well nourished. Alert and oriented, No acute distress. HEENT: [place] AT, moist mucus membranes. Trachea  midline, no masses. Cardiovascular: No clubbing, cyanosis, or edema. Respiratory: Normal respiratory effort, no increased work of breathing. Skin: No rashes, bruises or suspicious lesions. Lymph: No cervical or inguinal adenopathy. Neurologic: Grossly intact, no focal deficits, moving all 4 extremities. Psychiatric: Normal mood and affect.    Pertinent Imaging: Results for FOLASADE, MOOTY (MRN [id number]) as of 12/12/2016 11:12  Ref. Range 12/12/2016 10:32  Scan Result Unknown 150   Assessment & Plan:    1. Urge incontinence  - patient to continue to decrease her coffee intake  -  encouraged the patient to increase her water intake  - Patient's urge incontinence  is controlled with her Myrbetriq 50 mg daily.  PVR is minimal.   - BLADDER SCAN AMB NON-IMAGING  - RTC in 12 months for OAB questionnaire and PVR  2. Atrophic vaginitis  - advised patient to use coconut/olive oil for vaginal discomfort  - had a sample of Premarin for her today  - RTC in one year  Return in about 1 year (around 12/12/2017) for PVR, exam and OAB questionnaire.  These notes generated with voice recognition software. I apologize for typographical errors.  Zara Council, [facility] [address], [place], [place] [address] [phone]

## 2016-12-12 ENCOUNTER — Ambulatory Visit (INDEPENDENT_AMBULATORY_CARE_PROVIDER_SITE_OTHER): Payer: Medicare Other | Admitting: Urology

## 2016-12-12 ENCOUNTER — Encounter: Payer: Self-pay | Admitting: Urology

## 2016-12-12 VITALS — BP 151/76 | HR 90 | Ht 59.0 in | Wt 143.6 lb

## 2016-12-12 DIAGNOSIS — N3941 Urge incontinence: Secondary | ICD-10-CM

## 2016-12-12 DIAGNOSIS — N952 Postmenopausal atrophic vaginitis: Secondary | ICD-10-CM

## 2016-12-12 DIAGNOSIS — I6523 Occlusion and stenosis of bilateral carotid arteries: Secondary | ICD-10-CM

## 2016-12-12 LAB — BLADDER SCAN AMB NON-IMAGING: Scan Result: 150

## 2016-12-12 MED ORDER — MIRABEGRON ER 50 MG PO TB24: 50.0000 mg | ORAL_TABLET | Freq: Every day | ORAL | 3 refills | Status: DC

## 2017-01-02 IMAGING — RF DG ESOPHAGUS
6 of 8 series · 12 of 17 positions shown · non-contrast
Comparison: No recent prior.

CLINICAL DATA: Dysphasia.

EXAM:
ESOPHOGRAM / BARIUM SWALLOW / BARIUM TABLET STUDY
TECHNIQUE: Combined double contrast and single contrast examination performed
using effervescent crystals, thick barium liquid, and thin barium
liquid. The patient was observed with fluoroscopy swallowing a 13 mm
barium sulphate tablet.
FLUOROSCOPY TIME:  Radiation Exposure Index (as provided by the
fluoroscopic device): 11.9 mGy
Scratched

[Series 1: fluoro_barium 2fps_bw · 0.18mm/px · 3 of 6 frames shown (1 of 5)]
[frame 1/6]
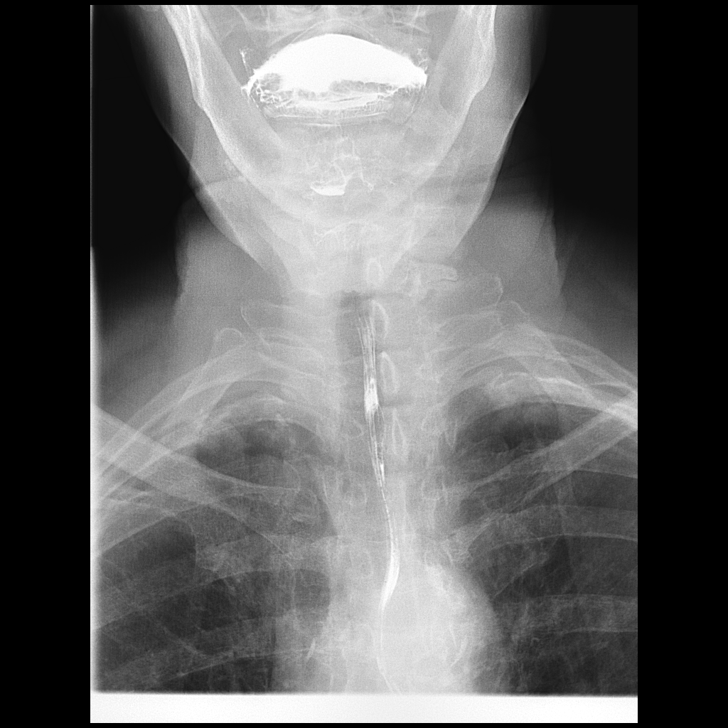
[frame 4/6]
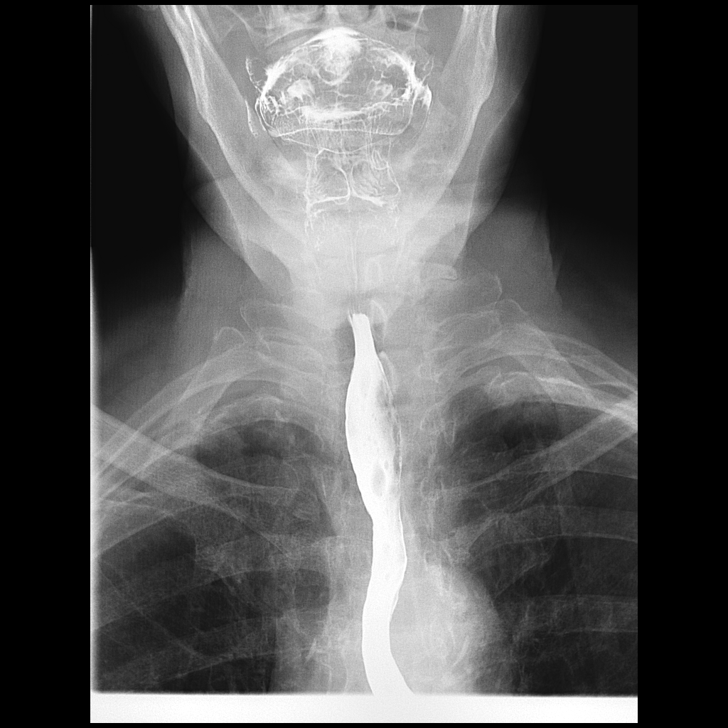
[frame 6/6]
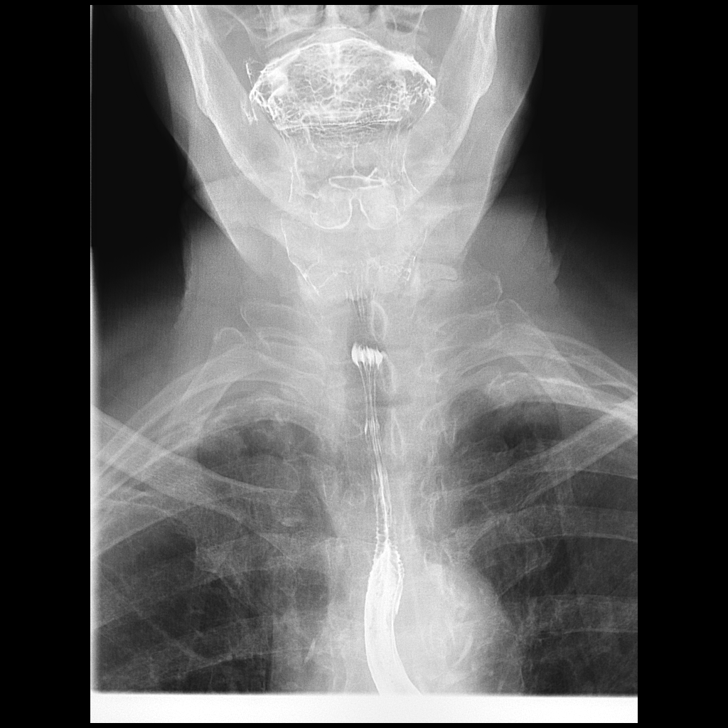

[Series 2: fluoro_barium 2fps_bw · 0.18mm/px · 3 of 5 frames shown (2 of 5)]
[frame 1/5]
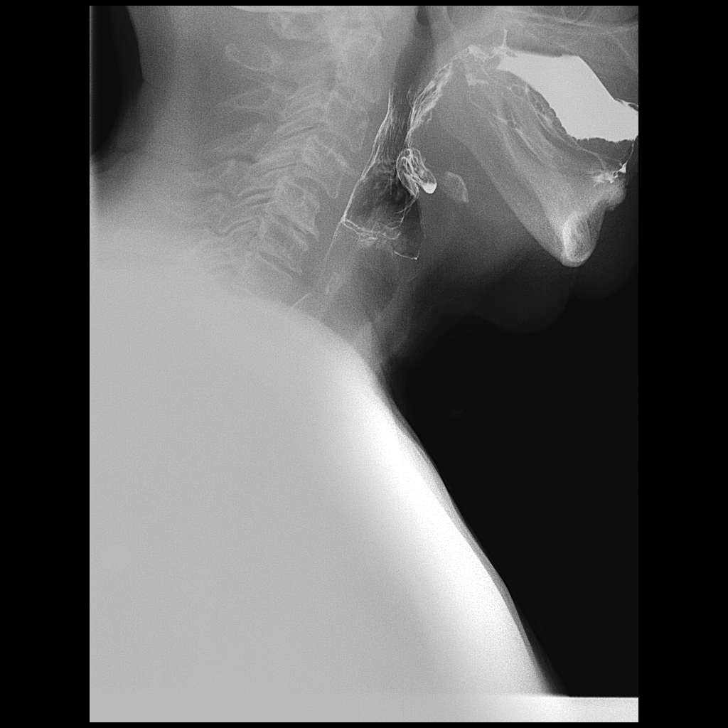
[frame 4/5]
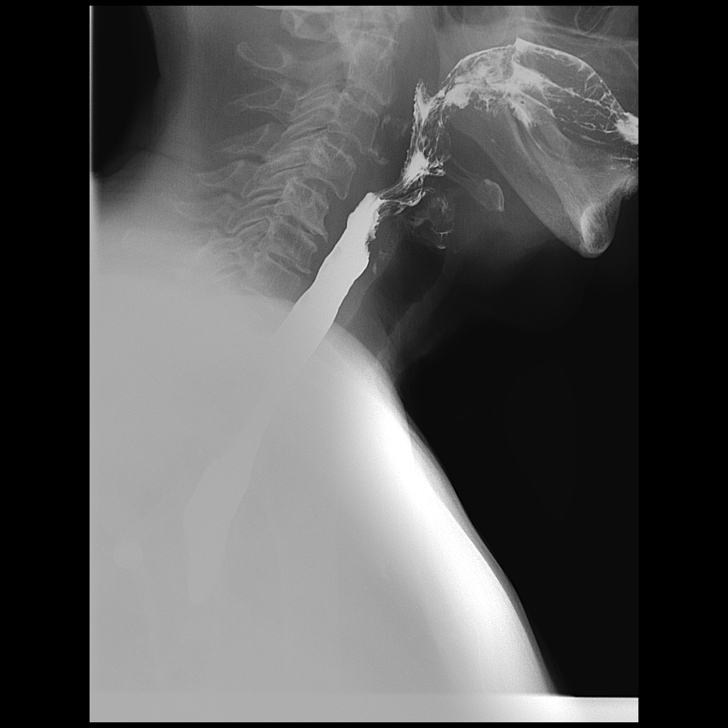
[frame 5/5]
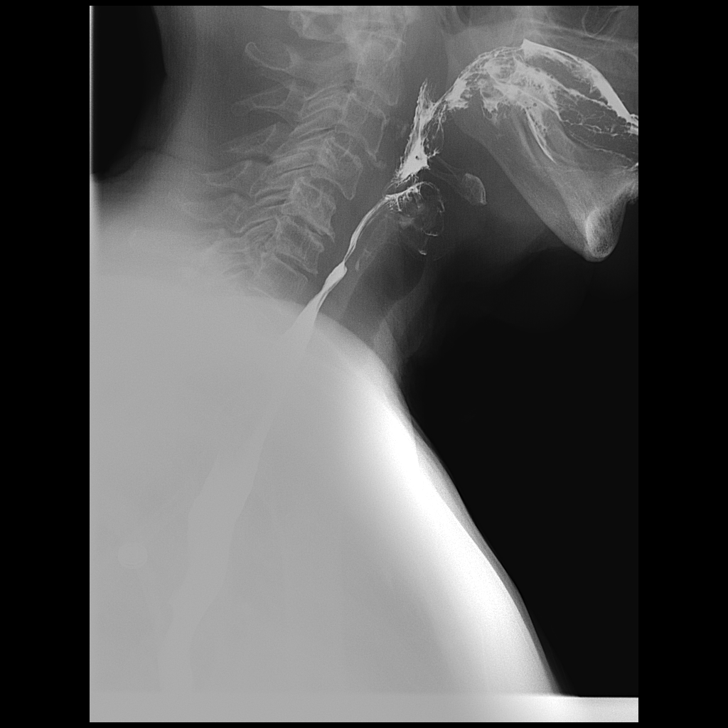

[Series 4: fluoro_barium 2fps_bw · 0.18mm/px · 1 of 1 slices shown (3 of 5)]
[im 1/1]
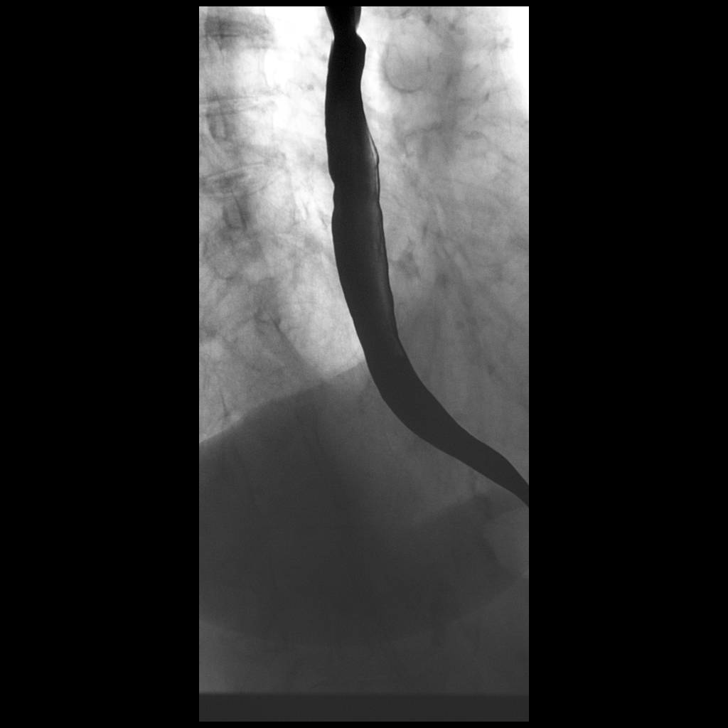

[Series 5: fluoro_barium 2fps_bw · 0.18mm/px · 1 of 1 slices shown (4 of 5)]
[im 1/1]
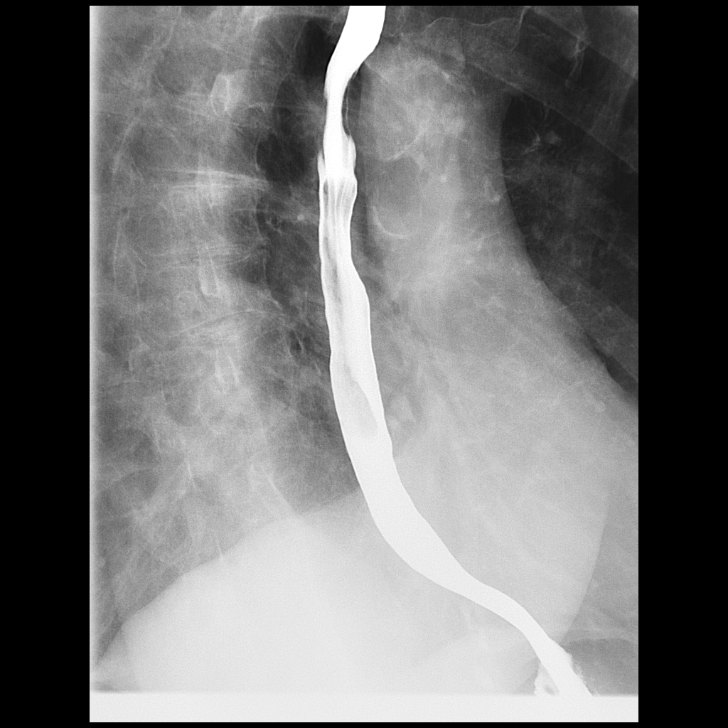

[Series 7: fluoro_barium 2fps_bw · 0.18mm/px · 1 of 1 slices shown (5 of 5)]
[im 1/1]
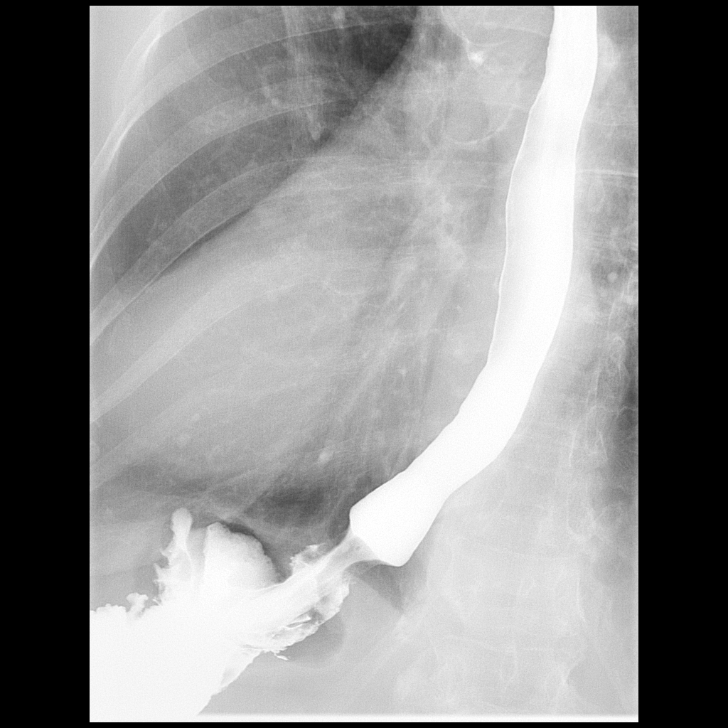

[Series 8: cp_standard · 0.36mm/px · 3 of 54 frames shown]
[frame 9/54]
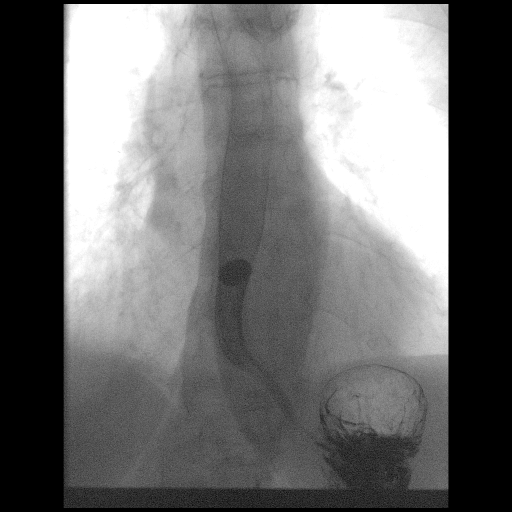
[frame 28/54]
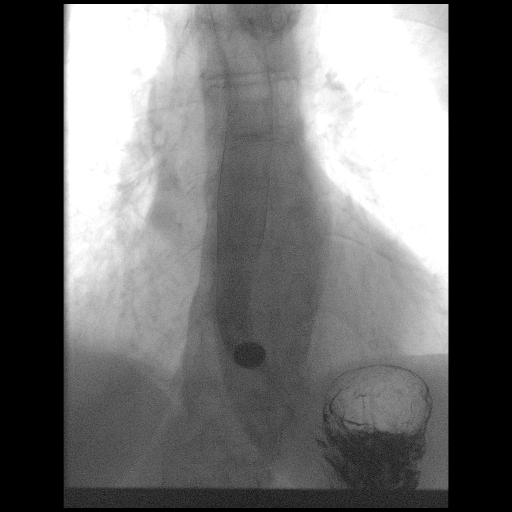
[frame 52/54]
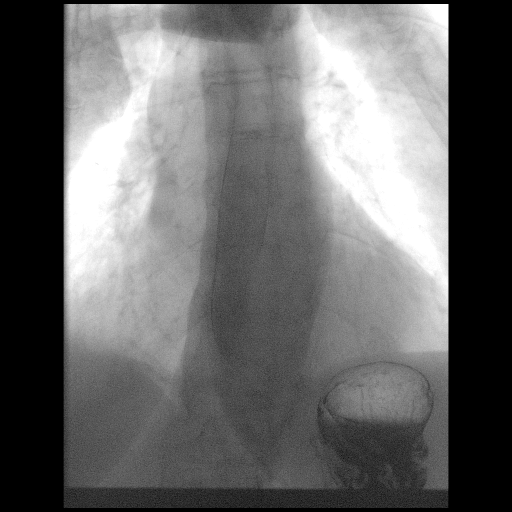

[12 of 17 positions shown; findings below may reference images not displayed]

FINDINGS: Cervical and thoracic esophagus are normal. Peristalsis normal. No
obstructing abnormality identified. No evidence of reflux.
Standardized barium tablet passed normally.
IMPRESSION: Normal exam.

## 2017-01-30 ENCOUNTER — Other Ambulatory Visit: Payer: Self-pay | Admitting: Internal Medicine

## 2017-01-30 DIAGNOSIS — Z1239 Encounter for other screening for malignant neoplasm of breast: Secondary | ICD-10-CM

## 2017-01-30 DIAGNOSIS — Z79899 Other long term (current) drug therapy: Secondary | ICD-10-CM | POA: Insufficient documentation

## 2017-03-05 ENCOUNTER — Ambulatory Visit
Admission: RE | Admit: 2017-03-05 | Discharge: 2017-03-05 | Disposition: A | Discharge status: Home / Self Care | Payer: Medicare Other | Source: Ambulatory Visit | Attending: Internal Medicine | Admitting: Internal Medicine

## 2017-03-05 DIAGNOSIS — Z1231 Encounter for screening mammogram for malignant neoplasm of breast: Secondary | ICD-10-CM | POA: Insufficient documentation

## 2017-03-05 DIAGNOSIS — Z1239 Encounter for other screening for malignant neoplasm of breast: Secondary | ICD-10-CM

## 2017-03-05 HISTORY — DX: Malignant (primary) neoplasm, unspecified: C80.1

## 2017-07-04 ENCOUNTER — Other Ambulatory Visit: Payer: Self-pay

## 2017-07-09 NOTE — Discharge Instructions (Signed)

## 2017-07-10 ENCOUNTER — Ambulatory Visit: Payer: Medicare Other | Admitting: Anesthesiology

## 2017-07-10 ENCOUNTER — Encounter
Admission: RE | Disposition: A | Discharge status: Home / Self Care | Payer: Self-pay | Source: Ambulatory Visit | Attending: Ophthalmology

## 2017-07-10 ENCOUNTER — Ambulatory Visit
Admission: RE | Admit: 2017-07-10 | Discharge: 2017-07-10 | Disposition: A | Discharge status: Home / Self Care | Payer: Medicare Other | Source: Ambulatory Visit | Attending: Ophthalmology | Admitting: Ophthalmology

## 2017-07-10 DIAGNOSIS — Z79899 Other long term (current) drug therapy: Secondary | ICD-10-CM | POA: Insufficient documentation

## 2017-07-10 DIAGNOSIS — K219 Gastro-esophageal reflux disease without esophagitis: Secondary | ICD-10-CM | POA: Diagnosis not present

## 2017-07-10 DIAGNOSIS — I10 Essential (primary) hypertension: Secondary | ICD-10-CM | POA: Diagnosis not present

## 2017-07-10 DIAGNOSIS — Z87891 Personal history of nicotine dependence: Secondary | ICD-10-CM | POA: Diagnosis not present

## 2017-07-10 DIAGNOSIS — J449 Chronic obstructive pulmonary disease, unspecified: Secondary | ICD-10-CM | POA: Diagnosis not present

## 2017-07-10 DIAGNOSIS — H2512 Age-related nuclear cataract, left eye: Secondary | ICD-10-CM | POA: Insufficient documentation

## 2017-07-10 HISTORY — PX: CATARACT EXTRACTION W/PHACO: SHX586

## 2017-07-10 HISTORY — DX: Unspecified asthma, uncomplicated: J45.909

## 2017-07-10 HISTORY — DX: Zoster without complications: B02.9

## 2017-07-10 HISTORY — DX: Nausea with vomiting, unspecified: R11.2

## 2017-07-10 HISTORY — DX: Restless legs syndrome: G25.81

## 2017-07-10 HISTORY — DX: Complete traumatic metacarpophalangeal amputation of unspecified finger, initial encounter: S68.119A

## 2017-07-10 HISTORY — DX: Presence of dental prosthetic device (complete) (partial): Z97.2

## 2017-07-10 HISTORY — DX: Dyspnea, unspecified: R06.00

## 2017-07-10 HISTORY — DX: Unspecified hearing loss, unspecified ear: H91.90

## 2017-07-10 HISTORY — DX: Rheumatic fever without heart involvement: I00

## 2017-07-10 HISTORY — DX: Other specified postprocedural states: Z98.890

## 2017-07-10 SURGERY — PHACOEMULSIFICATION, CATARACT, WITH IOL INSERTION
Anesthesia: Monitor Anesthesia Care | Laterality: Left | Wound class: Clean

## 2017-07-10 MED ORDER — MEPERIDINE HCL 25 MG/ML IJ SOLN: 6.2500 mg | INTRAMUSCULAR | Status: DC | PRN

## 2017-07-10 MED ORDER — PROMETHAZINE HCL 25 MG/ML IJ SOLN: 6.2500 mg | INTRAMUSCULAR | Status: DC | PRN

## 2017-07-10 MED ORDER — MOXIFLOXACIN HCL 0.5 % OP SOLN
OPHTHALMIC | Status: DC | PRN
  Administered 2017-07-10: 1 [drp] via OPHTHALMIC

## 2017-07-10 MED ORDER — SODIUM HYALURONATE 10 MG/ML IO SOLN
INTRAOCULAR | Status: DC | PRN
  Administered 2017-07-10: 0.55 mL via INTRAOCULAR

## 2017-07-10 MED ORDER — FENTANYL CITRATE (PF) 100 MCG/2ML IJ SOLN
INTRAMUSCULAR | Status: DC | PRN
  Administered 2017-07-10: 50 ug via INTRAVENOUS

## 2017-07-10 MED ORDER — SODIUM HYALURONATE 23 MG/ML IO SOLN
INTRAOCULAR | Status: DC | PRN
  Administered 2017-07-10: 0.6 mL via INTRAOCULAR

## 2017-07-10 MED ORDER — OXYCODONE HCL 5 MG PO TABS: 5.0000 mg | ORAL_TABLET | Freq: Once | ORAL | Status: DC | PRN

## 2017-07-10 MED ORDER — MIDAZOLAM HCL 2 MG/2ML IJ SOLN
INTRAMUSCULAR | Status: DC | PRN
  Administered 2017-07-10: 1 mg via INTRAVENOUS

## 2017-07-10 MED ORDER — BALANCED SALT IO SOLN
INTRAOCULAR | Status: DC | PRN
  Administered 2017-07-10: 1 mL via INTRAOCULAR

## 2017-07-10 MED ORDER — EPINEPHRINE PF 1 MG/ML IJ SOLN
INTRAOCULAR | Status: DC | PRN
  Administered 2017-07-10: 86 mL via OPHTHALMIC

## 2017-07-10 MED ORDER — ARMC OPHTHALMIC DILATING DROPS
1.0000 "application " | OPHTHALMIC | Status: DC | PRN
  Administered 2017-07-10 (×3): 1 via OPHTHALMIC

## 2017-07-10 MED ORDER — FENTANYL CITRATE (PF) 100 MCG/2ML IJ SOLN: 25.0000 ug | INTRAMUSCULAR | Status: DC | PRN

## 2017-07-10 MED ORDER — LACTATED RINGERS IV SOLN: 10.0000 mL/h | INTRAVENOUS | Status: DC

## 2017-07-10 MED ORDER — OXYCODONE HCL 5 MG/5ML PO SOLN: 5.0000 mg | Freq: Once | ORAL | Status: DC | PRN

## 2017-07-10 SURGICAL SUPPLY — 16 items
CANNULA ANT/CHMB 27GA (MISCELLANEOUS) ×3 IMPLANT
DISSECTOR HYDRO NUCLEUS 50X22 (MISCELLANEOUS) ×3 IMPLANT
GLOVE BIO SURGEON STRL SZ8 (GLOVE) ×3 IMPLANT
GLOVE SURG LX 7.5 STRW (GLOVE) ×2
GLOVE SURG LX STRL 7.5 STRW (GLOVE) ×1 IMPLANT
GOWN STRL REUS W/ TWL LRG LVL3 (GOWN DISPOSABLE) ×2 IMPLANT
GOWN STRL REUS W/TWL LRG LVL3 (GOWN DISPOSABLE) ×4
LENS IOL TECNIS ITEC 20.0 (Intraocular Lens) ×3 IMPLANT
MARKER SKIN DUAL TIP RULER LAB (MISCELLANEOUS) ×3 IMPLANT
PACK CATARACT (MISCELLANEOUS) ×3 IMPLANT
PACK DR. KING ARMS (PACKS) ×3 IMPLANT
PACK EYE AFTER SURG (MISCELLANEOUS) ×3 IMPLANT
SYR 3ML LL SCALE MARK (SYRINGE) ×3 IMPLANT
SYR TB 1ML LUER SLIP (SYRINGE) ×3 IMPLANT
WATER STERILE IRR 500ML POUR (IV SOLUTION) ×3 IMPLANT
WIPE NON LINTING 3.25X3.25 (MISCELLANEOUS) ×3 IMPLANT

## 2017-07-10 NOTE — Anesthesia Postprocedure Evaluation (Signed)
Anesthesia Post Note  Patient: Jody Wagner  Procedure(s) Performed: CATARACT EXTRACTION PHACO AND INTRAOCULAR LENS PLACEMENT (IOC) LEFT (Left )  Patient location during evaluation: PACU Anesthesia Type: MAC Level of consciousness: awake and alert Pain management: pain level controlled Vital Signs Assessment: post-procedure vital signs reviewed and stable Respiratory status: spontaneous breathing, nonlabored ventilation, respiratory function stable and patient connected to nasal cannula oxygen Cardiovascular status: blood pressure returned to baseline and stable Postop Assessment: no apparent nausea or vomiting Anesthetic complications: no    Meda Dudzinski ELAINE

## 2017-07-10 NOTE — Op Note (Signed)
OPERATIVE NOTE  Jody Wagner [id number] 07/10/2017   PREOPERATIVE DIAGNOSIS:  Nuclear sclerotic cataract left eye.  H25.12   POSTOPERATIVE DIAGNOSIS:    Nuclear sclerotic cataract left eye.     PROCEDURE:  Phacoemusification with posterior chamber intraocular lens placement of the left eye   LENS:   Implant Name Type Inv. Item Serial No. Manufacturer Lot No. LRB No. Used  LENS IOL DIOP 20.0 - [id number] Intraocular Lens LENS IOL DIOP 20.0 [id number] AMO  Left 1       PCB00 +20.0   ULTRASOUND TIME: 0 minutes 38 seconds.  CDE 6.90   SURGEON:  Benay Pillow, MD, MPH   ANESTHESIA:  Topical with tetracaine drops augmented with 1% preservative-free intracameral lidocaine.  ESTIMATED BLOOD LOSS: <1 mL   COMPLICATIONS:  None.   DESCRIPTION OF PROCEDURE:  The patient was identified in the holding room and transported to the operating room and placed in the supine position under the operating microscope.  The left eye was identified as the operative eye and it was prepped and draped in the usual sterile ophthalmic fashion.   A 1.0 millimeter clear-corneal paracentesis was made at the 5:00 position. 0.5 ml of preservative-free 1% lidocaine with epinephrine was injected into the anterior chamber.  The anterior chamber was filled with Healon 5 viscoelastic.  A 2.4 millimeter keratome was used to make a near-clear corneal incision at the 2:00 position.  A curvilinear capsulorrhexis was made with a cystotome and capsulorrhexis forceps. The rhexis was smaller than usual, decentered inferiorly slightly.  Balanced salt solution was used to hydrodissect and hydrodelineate the nucleus.   Phacoemulsification was then used in stop and chop fashion to remove the lens nucleus and epinucleus.  The remaining cortex was then removed using the irrigation and aspiration handpiece. Healon was then placed into the capsular bag to distend it for lens placement.  A lens was then injected into the capsular bag.   The remaining viscoelastic was aspirated.   Wounds were hydrated with balanced salt solution.  The anterior chamber was inflated to a physiologic pressure with balanced salt solution.  Intracameral vigamox 0.1 mL undiltued was injected into the eye and a drop placed onto the ocular surface.  No wound leaks were noted.  The patient was taken to the recovery room in stable condition without complications of anesthesia or surgery  Benay Pillow 07/10/2017, 10:48 AM

## 2017-07-10 NOTE — Anesthesia Preprocedure Evaluation (Signed)
Anesthesia Evaluation  Patient identified by MRN, date of birth, ID band Patient awake    Reviewed: Allergy & Precautions, H&P , NPO status , Patient's Chart, lab work & pertinent test results, reviewed documented beta blocker date and time   History of Anesthesia Complications (+) PONV and history of anesthetic complications  Airway Mallampati: II  TM Distance: >3 FB Neck ROM: full    Dental no notable dental hx.    Pulmonary shortness of breath, asthma , COPD, former smoker,    Pulmonary exam normal breath sounds clear to auscultation       Cardiovascular Exercise Tolerance: Good hypertension,  Rhythm:regular Rate:Normal     Neuro/Psych PSYCHIATRIC DISORDERS negative neurological ROS     GI/Hepatic Neg liver ROS, GERD  ,  Endo/Other  negative endocrine ROS  Renal/GU negative Renal ROS  negative genitourinary   Musculoskeletal negative musculoskeletal ROS (+)   Abdominal   Peds negative pediatric ROS (+)  Hematology negative hematology ROS (+)   Anesthesia Other Findings   Reproductive/Obstetrics negative OB ROS                             Anesthesia Physical Anesthesia Plan  ASA: III  Anesthesia Plan: MAC   Post-op Pain Management:    Induction:   PONV Risk Score and Plan:   Airway Management Planned:   Additional Equipment:   Intra-op Plan:   Post-operative Plan:   Informed Consent: I have reviewed the patients History and Physical, chart, labs and discussed the procedure including the risks, benefits and alternatives for the proposed anesthesia with the patient or authorized representative who has indicated his/her understanding and acceptance.   Dental Advisory Given  Plan Discussed with: CRNA  Anesthesia Plan Comments:         Anesthesia Quick Evaluation

## 2017-07-10 NOTE — H&P (Signed)
The History and Physical notes are on paper, have been signed, and are to be scanned.   I have examined the patient and there are no changes to the H&P.   Benay Pillow 07/10/2017 10:19 AM

## 2017-07-10 NOTE — Transfer of Care (Signed)
Immediate Anesthesia Transfer of Care Note  Patient: Jody Wagner  Procedure(s) Performed: CATARACT EXTRACTION PHACO AND INTRAOCULAR LENS PLACEMENT (IOC) LEFT (Left )  Patient Location: PACU  Anesthesia Type: MAC  Level of Consciousness: awake, alert  and patient cooperative  Airway and Oxygen Therapy: Patient Spontanous Breathing and Patient connected to supplemental oxygen  Post-op Assessment: Post-op Vital signs reviewed, Patient's Cardiovascular Status Stable, Respiratory Function Stable, Patent Airway and No signs of Nausea or vomiting  Post-op Vital Signs: Reviewed and stable  Complications: No apparent anesthesia complications

## 2017-07-10 NOTE — Anesthesia Procedure Notes (Signed)
Procedure Name: MAC Performed by: Kaidyn Hernandes, CRNA Pre-anesthesia Checklist: Patient identified, Emergency Drugs available, Suction available, Timeout performed and Patient being monitored Patient Re-evaluated:Patient Re-evaluated prior to induction Oxygen Delivery Method: Nasal cannula Placement Confirmation: positive ETCO2       

## 2017-07-11 ENCOUNTER — Encounter: Payer: Self-pay | Admitting: Ophthalmology

## 2017-07-30 ENCOUNTER — Encounter: Payer: Self-pay | Admitting: *Deleted

## 2017-08-01 DIAGNOSIS — I6523 Occlusion and stenosis of bilateral carotid arteries: Secondary | ICD-10-CM | POA: Insufficient documentation

## 2017-08-01 DIAGNOSIS — E538 Deficiency of other specified B group vitamins: Secondary | ICD-10-CM | POA: Insufficient documentation

## 2017-08-03 NOTE — Discharge Instructions (Signed)

## 2017-08-07 ENCOUNTER — Ambulatory Visit: Payer: Medicare Other | Admitting: Anesthesiology

## 2017-08-07 ENCOUNTER — Ambulatory Visit
Admission: RE | Admit: 2017-08-07 | Discharge: 2017-08-07 | Disposition: A | Discharge status: Home / Self Care | Payer: Medicare Other | Source: Ambulatory Visit | Attending: Ophthalmology | Admitting: Ophthalmology

## 2017-08-07 ENCOUNTER — Encounter
Admission: RE | Disposition: A | Discharge status: Home / Self Care | Payer: Self-pay | Source: Ambulatory Visit | Attending: Ophthalmology

## 2017-08-07 DIAGNOSIS — N183 Chronic kidney disease, stage 3 (moderate): Secondary | ICD-10-CM | POA: Diagnosis not present

## 2017-08-07 DIAGNOSIS — J449 Chronic obstructive pulmonary disease, unspecified: Secondary | ICD-10-CM | POA: Insufficient documentation

## 2017-08-07 DIAGNOSIS — I129 Hypertensive chronic kidney disease with stage 1 through stage 4 chronic kidney disease, or unspecified chronic kidney disease: Secondary | ICD-10-CM | POA: Insufficient documentation

## 2017-08-07 DIAGNOSIS — K219 Gastro-esophageal reflux disease without esophagitis: Secondary | ICD-10-CM | POA: Diagnosis not present

## 2017-08-07 DIAGNOSIS — Z79899 Other long term (current) drug therapy: Secondary | ICD-10-CM | POA: Insufficient documentation

## 2017-08-07 DIAGNOSIS — H2511 Age-related nuclear cataract, right eye: Secondary | ICD-10-CM | POA: Insufficient documentation

## 2017-08-07 DIAGNOSIS — Z87891 Personal history of nicotine dependence: Secondary | ICD-10-CM | POA: Diagnosis not present

## 2017-08-07 HISTORY — PX: CATARACT EXTRACTION W/PHACO: SHX586

## 2017-08-07 SURGERY — PHACOEMULSIFICATION, CATARACT, WITH IOL INSERTION
Anesthesia: General | Laterality: Right | Wound class: Clean

## 2017-08-07 MED ORDER — MIDAZOLAM HCL 2 MG/2ML IJ SOLN
INTRAMUSCULAR | Status: DC | PRN
  Administered 2017-08-07: 1.5 mg via INTRAVENOUS

## 2017-08-07 MED ORDER — FENTANYL CITRATE (PF) 100 MCG/2ML IJ SOLN
INTRAMUSCULAR | Status: DC | PRN
  Administered 2017-08-07: 50 ug via INTRAVENOUS

## 2017-08-07 MED ORDER — ARMC OPHTHALMIC DILATING DROPS
1.0000 "application " | OPHTHALMIC | Status: DC | PRN
  Administered 2017-08-07 (×3): 1 via OPHTHALMIC

## 2017-08-07 MED ORDER — BRIMONIDINE TARTRATE-TIMOLOL 0.2-0.5 % OP SOLN
OPHTHALMIC | Status: DC | PRN
  Administered 2017-08-07: 1 [drp] via OPHTHALMIC

## 2017-08-07 MED ORDER — SODIUM HYALURONATE 10 MG/ML IO SOLN
INTRAOCULAR | Status: DC | PRN
  Administered 2017-08-07: 0.55 mL via INTRAOCULAR

## 2017-08-07 MED ORDER — ACETAMINOPHEN 160 MG/5ML PO SOLN
325.0000 mg | ORAL | Status: DC | PRN
Start: 2017-08-07 — End: 2017-08-07

## 2017-08-07 MED ORDER — LACTATED RINGERS IV SOLN: INTRAVENOUS | Status: DC

## 2017-08-07 MED ORDER — LIDOCAINE HCL (PF) 2 % IJ SOLN
INTRAMUSCULAR | Status: DC | PRN
  Administered 2017-08-07: 1 mL via INTRAOCULAR

## 2017-08-07 MED ORDER — ACETAMINOPHEN 325 MG PO TABS: 650.0000 mg | ORAL_TABLET | Freq: Once | ORAL | Status: DC | PRN

## 2017-08-07 MED ORDER — MOXIFLOXACIN HCL 0.5 % OP SOLN
OPHTHALMIC | Status: DC | PRN
  Administered 2017-08-07: 0.2 mL via OPHTHALMIC

## 2017-08-07 MED ORDER — BSS IO SOLN
INTRAOCULAR | Status: DC | PRN
  Administered 2017-08-07: 72 mL via OPHTHALMIC

## 2017-08-07 MED ORDER — SODIUM HYALURONATE 23 MG/ML IO SOLN
INTRAOCULAR | Status: DC | PRN
  Administered 2017-08-07: 0.6 mL via INTRAOCULAR

## 2017-08-07 MED ORDER — ONDANSETRON HCL 4 MG/2ML IJ SOLN: 4.0000 mg | Freq: Once | INTRAMUSCULAR | Status: DC | PRN

## 2017-08-07 SURGICAL SUPPLY — 16 items

## 2017-08-07 NOTE — Anesthesia Preprocedure Evaluation (Signed)
Anesthesia Evaluation  Patient identified by MRN, date of birth, ID band Patient awake    History of Anesthesia Complications (+) PONV and history of anesthetic complications  Airway Mallampati: I  TM Distance: >3 FB Neck ROM: Full    Dental  (+) Upper Dentures   Pulmonary asthma , COPD, former smoker (quit over 20 years ago),    Pulmonary exam normal breath sounds clear to auscultation       Cardiovascular Exercise Tolerance: Good hypertension, Normal cardiovascular exam Rhythm:Regular Rate:Normal     Neuro/Psych negative neurological ROS     GI/Hepatic GERD  ,  Endo/Other  negative endocrine ROS  Renal/GU Renal disease (stage III CKD)     Musculoskeletal   Abdominal   Peds  Hematology negative hematology ROS (+)   Anesthesia Other Findings   Reproductive/Obstetrics                             Anesthesia Physical Anesthesia Plan  ASA: III  Anesthesia Plan: General   Post-op Pain Management:    Induction: Intravenous  PONV Risk Score and Plan:   Airway Management Planned: Natural Airway  Additional Equipment:   Intra-op Plan:   Post-operative Plan:   Informed Consent: I have reviewed the patients History and Physical, chart, labs and discussed the procedure including the risks, benefits and alternatives for the proposed anesthesia with the patient or authorized representative who has indicated his/her understanding and acceptance.     Plan Discussed with: CRNA  Anesthesia Plan Comments:         Anesthesia Quick Evaluation

## 2017-08-07 NOTE — H&P (Signed)
The History and Physical notes are on paper, have been signed, and are to be scanned.   I have examined the patient and there are no changes to the H&P.   Benay Pillow 08/07/2017 12:11 PM

## 2017-08-07 NOTE — Op Note (Signed)
OPERATIVE NOTE  Jody Wagner [id number] 08/07/2017   PREOPERATIVE DIAGNOSIS:  Nuclear sclerotic cataract right eye.  H25.11   POSTOPERATIVE DIAGNOSIS:    Nuclear sclerotic cataract right eye.     PROCEDURE:  Phacoemusification with posterior chamber intraocular lens placement of the right eye   LENS:   Implant Name Type Inv. Item Serial No. Manufacturer Lot No. LRB No. Used  LENS IOL DIOP 20.0 - [id number] Intraocular Lens LENS IOL DIOP 20.0 [id number]  Right 1       PCB00 +20.0   ULTRASOUND TIME: 0 minutes 43.5 seconds.  CDE 6.16   SURGEON:  Benay Pillow, MD, MPH  ANESTHESIOLOGIST: Anesthesiologist: Darrin Nipper, MD CRNA: Mayme Genta, CRNA   ANESTHESIA:  Topical with tetracaine drops augmented with 1% preservative-free intracameral lidocaine.  ESTIMATED BLOOD LOSS: less than 1 mL.   COMPLICATIONS:  None.   DESCRIPTION OF PROCEDURE:  The patient was identified in the holding room and transported to the operating room and placed in the supine position under the operating microscope.  The right eye was identified as the operative eye and it was prepped and draped in the usual sterile ophthalmic fashion.   A 1.0 millimeter clear-corneal paracentesis was made at the 10:30 position. 0.5 ml of preservative-free 1% lidocaine with epinephrine was injected into the anterior chamber.  The anterior chamber was filled with Healon 5 viscoelastic.  A 2.4 millimeter keratome was used to make a near-clear corneal incision at the 8:00 position.  A curvilinear capsulorrhexis was made with a cystotome and capsulorrhexis forceps.  Balanced salt solution was used to hydrodissect and hydrodelineate the nucleus.   Phacoemulsification was then used in stop and chop fashion to remove the lens nucleus and epinucleus.  The remaining cortex was then removed using the irrigation and aspiration handpiece. Healon was then placed into the capsular bag to distend it for lens placement.  A lens was  then injected into the capsular bag.  The remaining viscoelastic was aspirated.   Wounds were hydrated with balanced salt solution.  The anterior chamber was inflated to a physiologic pressure with balanced salt solution.   Intracameral vigamox 0.1 mL undiluted was injected into the eye and a drop placed onto the ocular surface.  No wound leaks were noted.    Combigan was placed on the ocular surface.  The patient was taken to the recovery room in stable condition without complications of anesthesia or surgery  Benay Pillow 08/07/2017, 1:13 PM

## 2017-08-07 NOTE — Anesthesia Postprocedure Evaluation (Signed)
Anesthesia Post Note  Patient: Jody Wagner  Procedure(s) Performed: CATARACT EXTRACTION PHACO AND INTRAOCULAR LENS PLACEMENT (IOC) RIGHT (Right )  Patient location during evaluation: PACU Anesthesia Type: General Level of consciousness: awake and alert, oriented and patient cooperative Pain management: pain level controlled Vital Signs Assessment: post-procedure vital signs reviewed and stable Respiratory status: spontaneous breathing, nonlabored ventilation and respiratory function stable Cardiovascular status: blood pressure returned to baseline and stable Postop Assessment: adequate PO intake Anesthetic complications: no    Darrin Nipper

## 2017-08-07 NOTE — Transfer of Care (Signed)
Immediate Anesthesia Transfer of Care Note  Patient: Jody Wagner  Procedure(s) Performed: CATARACT EXTRACTION PHACO AND INTRAOCULAR LENS PLACEMENT (IOC) RIGHT (Right )  Patient Location: PACU  Anesthesia Type: General  Level of Consciousness: awake, alert  and patient cooperative  Airway and Oxygen Therapy: Patient Spontanous Breathing and Patient connected to supplemental oxygen  Post-op Assessment: Post-op Vital signs reviewed, Patient's Cardiovascular Status Stable, Respiratory Function Stable, Patent Airway and No signs of Nausea or vomiting  Post-op Vital Signs: Reviewed and stable  Complications: No apparent anesthesia complications

## 2017-08-07 NOTE — Anesthesia Procedure Notes (Signed)
Procedure Name: MAC Performed by: Jaceon Heiberger, CRNA Pre-anesthesia Checklist: Patient identified, Emergency Drugs available, Suction available, Timeout performed and Patient being monitored Patient Re-evaluated:Patient Re-evaluated prior to induction Oxygen Delivery Method: Nasal cannula Placement Confirmation: positive ETCO2       

## 2017-08-08 ENCOUNTER — Encounter: Payer: Self-pay | Admitting: Ophthalmology

## 2017-09-05 ENCOUNTER — Other Ambulatory Visit: Payer: Self-pay | Admitting: Urology

## 2017-10-15 ENCOUNTER — Encounter (INDEPENDENT_AMBULATORY_CARE_PROVIDER_SITE_OTHER): Payer: Self-pay | Admitting: Vascular Surgery

## 2017-10-15 ENCOUNTER — Ambulatory Visit (INDEPENDENT_AMBULATORY_CARE_PROVIDER_SITE_OTHER): Payer: Medicare Other | Admitting: Vascular Surgery

## 2017-10-15 ENCOUNTER — Ambulatory Visit (INDEPENDENT_AMBULATORY_CARE_PROVIDER_SITE_OTHER): Payer: Medicare Other

## 2017-10-15 VITALS — BP 155/68 | HR 68 | Resp 16 | Ht 59.0 in | Wt 146.0 lb

## 2017-10-15 DIAGNOSIS — I10 Essential (primary) hypertension: Secondary | ICD-10-CM

## 2017-10-15 DIAGNOSIS — I6522 Occlusion and stenosis of left carotid artery: Secondary | ICD-10-CM

## 2017-10-15 DIAGNOSIS — J439 Emphysema, unspecified: Secondary | ICD-10-CM

## 2017-10-15 NOTE — Progress Notes (Signed)
MRN : [id number]  Jody Wagner is a 84 y.o. ([birth year]) female who presents with chief complaint of  Chief Complaint  Patient presents with  . Carotid    1 year f/u  .  History of Present Illness: The patient is seen for follow up evaluation of carotid stenosis. The carotid stenosis followed by ultrasound.   The patient denies amaurosis fugax. There is no recent history of TIA symptoms or focal motor deficits. There is no prior documented CVA.  The patient is taking enteric-coated aspirin 81 mg daily.  There is no history of migraine headaches. There is no history of seizures.  The patient has a history of coronary artery disease, no recent episodes of angina or shortness of breath. The patient denies PAD or claudication symptoms. There is a history of hyperlipidemia which is being treated with a statin.    Carotid Duplex done today shows RICA <93% and LICA 26-71%.  Mild change of the LICA compared to last study in 10/12/2016  Current Meds  Medication Sig  . acetaminophen (TYLENOL) 500 MG tablet Take by mouth.  Marland Kitchen aspirin EC 81 MG tablet Take by mouth.  . Carboxymethylcellul-Glycerin (OPTIVE) 0.5-0.9 % SOLN Apply to eye.  . cetirizine (ZYRTEC) 10 MG tablet Take by mouth. Reported on 11/22/2015  . Cholecalciferol (VITAMIN D3) 2000 units capsule Take by mouth.  . conjugated estrogens (PREMARIN) vaginal cream Place 1 Applicatorful at bedtime vaginally. Amount(size of pea)  . cyanocobalamin 100 MCG tablet Take 100 mcg by mouth daily.  . DUREZOL 0.05 % EMUL   . fluticasone (FLONASE) 50 MCG/ACT nasal spray Place 2 sprays daily as needed into the nose.   Marland Kitchen HYDROcodone-acetaminophen (NORCO/VICODIN) 5-325 MG tablet hydrocodone 5 mg-acetaminophen 325 mg tablet  . MYRBETRIQ 50 MG TB24 tablet TAKE (1) TABLET BY MOUTH EVERY DAY  . omeprazole (PRILOSEC) 20 MG capsule Take 2 (two) times daily by mouth. Am and bedtime  . Polyethyl Glycol-Propyl Glycol (SYSTANE) 0.4-0.3 % GEL ophthalmic gel  Place 1 application as needed into both eyes (at bedtime).   . Propylene Glycol (SYSTANE BALANCE OP) Apply to eye as needed.  Marland Kitchen rOPINIRole (REQUIP) 0.5 MG tablet Take at bedtime by mouth.   . sertraline (ZOLOFT) 50 MG tablet Take at bedtime by mouth.   . [DISCONTINUED] albuterol (PROVENTIL HFA;VENTOLIN HFA) 108 (90 Base) MCG/ACT inhaler Inhale into the lungs.    Past Medical History:  Diagnosis Date  . Acid reflux   . Allergic rhinitis   . Amputation finger    as a child  . Anxiety   . Arthritis   . Asthma   . Atrophic vaginitis   . Cancer (HCC)    squamous cell -on cheek  . COPD (chronic obstructive pulmonary disease) ([place])   . Depression   . Dyspnea    with exertion/ occasional wheezing  . Dysuria   . Heart murmur   . Heartburn   . HLD (hyperlipidemia)   . HOH (hard of hearing)   . HTN (hypertension)   . Nocturia   . Obesity   . PONV (postoperative nausea and vomiting)   . Popliteal pain   . Restless leg syndrome   . Rheumatic fever 1953   hx of/ damaged valves/ scared  . RLS (restless legs syndrome)   . Shingles    hx of  . Urge incontinence   . Urinary frequency   . Urinary incontinence   . Urinary urgency   . Wears dentures    upper  Past Surgical History:  Procedure Laterality Date  . arm surgery Left 2013,2014   pulled from socket  . CATARACT EXTRACTION W/PHACO Left 07/10/2017   Procedure: CATARACT EXTRACTION PHACO AND INTRAOCULAR LENS PLACEMENT (IOC) LEFT;  Surgeon: Eulogio Bear, MD;  Location: [facility];  Service: Ophthalmology;  Laterality: Left;  . CATARACT EXTRACTION W/PHACO Right 08/07/2017   Procedure: CATARACT EXTRACTION PHACO AND INTRAOCULAR LENS PLACEMENT ([place]) RIGHT;  Surgeon: Eulogio Bear, MD;  Location: [facility];  Service: Ophthalmology;  Laterality: Right;  . rheumatic fever  1953   damaged valves  . SHOULDER SURGERY Left   . skin cancer removal  08/2015   squamous cell/ MOHS  . WRIST SURGERY Left 2003     Social History Social History   Tobacco Use  . Smoking status: Former Smoker    Packs/day: 1.00    Types: Cigarettes  . Smokeless tobacco: Never Used  . Tobacco comment: quit 15years  Substance Use Topics  . Alcohol use: No    Alcohol/week: 0.0 oz  . Drug use: No    Family History Family History  Problem Relation Age of Onset  . Heart disease Unknown   . Prostate cancer Father   . Prostate cancer Brother   . Lung cancer Sister   . Cancer Sister        intestines  . Kidney cancer Neg Hx   . Bladder Cancer Neg Hx   . Kidney disease Neg Hx   . Breast cancer Neg Hx     Allergies  Allergen Reactions  . Other Swelling    Vomit   "Wild Mushrooms" vomit  . Lovastatin Other (See Comments)  . Latex Itching    Only to bandaids     REVIEW OF SYSTEMS (Negative unless checked)  Constitutional: [] Weight loss  [] Fever  [] Chills Cardiac: [] Chest pain   [] Chest pressure   [] Palpitations   [] Shortness of breath when laying flat   [] Shortness of breath with exertion. Vascular:  [] Pain in legs with walking   [] Pain in legs at rest  [] History of DVT   [] Phlebitis   [] Swelling in legs   [] Varicose veins   [] Non-healing ulcers Pulmonary:   [] Uses home oxygen   [] Productive cough   [] Hemoptysis   [] Wheeze  [] COPD   [] Asthma Neurologic:  [] Dizziness   [] Seizures   [] History of stroke   [] History of TIA  [] Aphasia   [] Vissual changes   [] Weakness or numbness in arm   [] Weakness or numbness in leg Musculoskeletal:   [] Joint swelling   [] Joint pain   [] Low back pain Hematologic:  [] Easy bruising  [] Easy bleeding   [] Hypercoagulable state   [] Anemic Gastrointestinal:  [] Diarrhea   [] Vomiting  [] Gastroesophageal reflux/heartburn   [] Difficulty swallowing. Genitourinary:  [] Chronic kidney disease   [] Difficult urination  [] Frequent urination   [] Blood in urine Skin:  [] Rashes   [] Ulcers  Psychological:  [] History of anxiety   []  History of major depression.  Physical  Examination  Vitals:   10/15/17 1048  BP: (!) 155/68  Pulse: 68  Resp: 16  Weight: 146 lb (66.2 kg)  Height: 4\' 11"  (1.499 m)   Body mass index is 29.49 kg/m. Gen: WD/WN, NAD Head: [place]/AT, No temporalis wasting.  Ear/Nose/Throat: Hearing grossly intact, nares w/o erythema or drainage Eyes: PER, EOMI, sclera nonicteric.  Neck: Supple, no large masses.   Pulmonary:  Good air movement, no audible wheezing bilaterally, no use of accessory muscles.  Cardiac: RRR, no JVD Vascular: bilateral  carotid bruits Vessel Right Left  Radial Palpable Palpable  Ulnar Palpable Palpable  Brachial Palpable Palpable  Carotid Palpable Palpable  Gastrointestinal: Non-distended. No guarding/no peritoneal signs.  Musculoskeletal: M/S 5/5 throughout.  No deformity or atrophy.  Neurologic: CN 2-12 intact. Symmetrical.  Speech is fluent. Motor exam as listed above. Psychiatric: Judgment intact, Mood & affect appropriate for pt's clinical situation. Dermatologic: No rashes or ulcers noted.  No changes consistent with cellulitis. Lymph : No lichenification or skin changes of chronic lymphedema.  CBC No results found for: WBC, HGB, HCT, MCV, PLT  BMET No results found for: NA, K, CL, CO2, GLUCOSE, BUN, CREATININE, CALCIUM, GFRNONAA, GFRAA CrCl cannot be calculated (No order found.).  COAG No results found for: INR, PROTIME  Radiology No results found.   Assessment/Plan 1. Stenosis of left carotid artery Recommend:  Given the patient's asymptomatic subcritical stenosis no further invasive testing or surgery at this time.  Carotid Duplex done today shows RICA <62% and LICA 03-55%.  Mild change of the LICA compared to last study in 10/12/2016  Continue antiplatelet therapy as prescribed Continue management of CAD, HTN and Hyperlipidemia Healthy heart diet,  encouraged exercise at least 4 times per week Follow up in 12 months with duplex ultrasound and physical exam based   - VAS US CAROTID;  Future  2. Essential hypertension Continue antihypertensive medications as already ordered, these medications have been reviewed and there are no changes at this time.   3. Pulmonary emphysema, unspecified emphysema type ([place]) Continue pulmonary medications and aerosols as already ordered, these medications have been reviewed and there are no changes at this time.      Hortencia Pilar, MD  10/15/2017 12:23 PM

## 2017-12-13 ENCOUNTER — Ambulatory Visit: Payer: Medicare Other | Admitting: Urology

## 2018-01-22 ENCOUNTER — Ambulatory Visit: Payer: Medicare Other | Admitting: Urology

## 2018-02-06 NOTE — Progress Notes (Signed)
02/07/2018 11:21 AM   Jody Wagner 05-28-1931 [id number]  Referring provider: Ezequiel Kayser, MD [facility] [place], [place] [address]  Chief Complaint  Patient presents with  . Urge Incontinence    HPI: Patient is an 84 year old Caucasian female with urge incontinence and atrophic vaginitis who presents today for follow up.  Urge incontinence She is experiencing urgency x 0-3 (unchanged), frequency x 4-7 (unchanged), still limiting fluids to reduce bathroom trips, still engages in toilet mapping, incontinence x 4-7 (worsening) and nocturia x 0-3 (stable).  She is not had dysuria, gross hematuria or suprapubic pain.  Denies any fevers, chills, nausea or vomiting.  She is wearing three pads daily.  She states she leaks when she stands up.  She has tried to cut down on coffee.  She still does not drink water.  Her PVR today is 0 mL.  Her BP is 146/87.    Vaginal atrophy She is using vaginal estrogen cream three nights weekly.  She is reliant on samples.    PMH: Past Medical History:  Diagnosis Date  . Acid reflux   . Allergic rhinitis   . Amputation finger    as a child  . Anxiety   . Arthritis   . Asthma   . Atrophic vaginitis   . Cancer (HCC)    squamous cell -on cheek  . COPD (chronic obstructive pulmonary disease) ([place])   . Depression   . Dyspnea    with exertion/ occasional wheezing  . Dysuria   . Heart murmur   . Heartburn   . HLD (hyperlipidemia)   . HOH (hard of hearing)   . HTN (hypertension)   . Nocturia   . Obesity   . PONV (postoperative nausea and vomiting)   . Popliteal pain   . Restless leg syndrome   . Rheumatic fever 1953   hx of/ damaged valves/ scared  . RLS (restless legs syndrome)   . Shingles    hx of  . Urge incontinence   . Urinary frequency   . Urinary incontinence   . Urinary urgency   . Wears dentures    upper    Surgical History: Past Surgical History:  Procedure Laterality Date  . arm surgery  Left 2013,2014   pulled from socket  . CATARACT EXTRACTION W/PHACO Left 07/10/2017   Procedure: CATARACT EXTRACTION PHACO AND INTRAOCULAR LENS PLACEMENT (IOC) LEFT;  Surgeon: Eulogio Bear, MD;  Location: [facility];  Service: Ophthalmology;  Laterality: Left;  . CATARACT EXTRACTION W/PHACO Right 08/07/2017   Procedure: CATARACT EXTRACTION PHACO AND INTRAOCULAR LENS PLACEMENT ([place]) RIGHT;  Surgeon: Eulogio Bear, MD;  Location: [facility];  Service: Ophthalmology;  Laterality: Right;  . rheumatic fever  1953   damaged valves  . SHOULDER SURGERY Left   . skin cancer removal  08/2015   squamous cell/ MOHS  . WRIST SURGERY Left 2003    Home Medications:  Allergies as of 02/07/2018      Reactions   Other Swelling   Vomit   "Wild Mushrooms" vomit   Lovastatin Other (See Comments)   Latex Itching   Only to bandaids      Medication List        Accurate as of 02/07/18 11:21 AM. Always use your most recent med list.          aspirin EC 81 MG tablet Take by mouth.   cetirizine 10 MG tablet Commonly known as:  ZYRTEC  Take by mouth. Reported on 11/22/2015   conjugated estrogens vaginal cream Commonly known as:  PREMARIN Place 1 Applicatorful at bedtime vaginally. Amount(size of pea)   cyanocobalamin 100 MCG tablet Take 100 mcg by mouth daily.   DUREZOL 0.05 % Emul Generic drug:  Difluprednate   fluticasone 50 MCG/ACT nasal spray Commonly known as:  FLONASE Place 2 sprays daily as needed into the nose.   HYDROcodone-acetaminophen 5-325 MG tablet Commonly known as:  NORCO/VICODIN hydrocodone 5 mg-acetaminophen 325 mg tablet   losartan 100 MG tablet Commonly known as:  COZAAR Take by mouth.   MYRBETRIQ 50 MG Tb24 tablet Generic drug:  mirabegron ER TAKE (1) TABLET BY MOUTH EVERY DAY   omeprazole 20 MG capsule Commonly known as:  PRILOSEC Take 2 (two) times daily by mouth. Am and bedtime   OPTIVE 0.5-0.9 % ophthalmic solution Generic  drug:  carboxymethylcellul-glycerin Apply to eye.   PROAIR HFA 108 (90 Base) MCG/ACT inhaler Generic drug:  albuterol Inhale into the lungs.   REQUIP 0.5 MG tablet Generic drug:  rOPINIRole Take at bedtime by mouth.   sertraline 50 MG tablet Commonly known as:  ZOLOFT Take at bedtime by mouth.   SYSTANE 0.4-0.3 % Gel ophthalmic gel Generic drug:  Polyethyl Glycol-Propyl Glycol Place 1 application as needed into both eyes (at bedtime).   SYSTANE BALANCE OP Apply to eye as needed.   TYLENOL 500 MG tablet Generic drug:  acetaminophen Take by mouth.   Vitamin D3 2000 units capsule Take by mouth.       Allergies:  Allergies  Allergen Reactions  . Other Swelling    Vomit   "Wild Mushrooms" vomit  . Lovastatin Other (See Comments)  . Latex Itching    Only to bandaids    Family History: Family History  Problem Relation Age of Onset  . Heart disease Unknown   . Prostate cancer Father   . Prostate cancer Brother   . Lung cancer Sister   . Cancer Sister        intestines  . Kidney cancer Neg Hx   . Bladder Cancer Neg Hx   . Kidney disease Neg Hx   . Breast cancer Neg Hx     Social History:  reports that she has quit smoking. Her smoking use included cigarettes. She smoked 1.00 pack per day. She has never used smokeless tobacco. She reports that she does not drink alcohol or use drugs.  ROS: UROLOGY Frequent Urination?: No Hard to postpone urination?: Yes Burning/pain with urination?: Yes Get up at night to urinate?: Yes Leakage of urine?: Yes Urine stream starts and stops?: No Trouble starting stream?: No Do you have to strain to urinate?: No Blood in urine?: No Urinary tract infection?: No Sexually transmitted disease?: No Injury to kidneys or bladder?: No Painful intercourse?: No Weak stream?: No Currently pregnant?: No Vaginal bleeding?: No Last menstrual period?: n  Gastrointestinal Nausea?: No Vomiting?: No Indigestion/heartburn?:  No Diarrhea?: No Constipation?: No  Constitutional Fever: No Night sweats?: No Weight loss?: No Fatigue?: Yes(y)  Skin Skin rash/lesions?: No Itching?: No  Eyes Blurred vision?: No Double vision?: No  Ears/Nose/Throat Sore throat?: No Sinus problems?: Yes  Hematologic/Lymphatic Swollen glands?: No Easy bruising?: Yes  Cardiovascular Leg swelling?: No Chest pain?: No  Respiratory Cough?: Yes Shortness of breath?: Yes  Endocrine Excessive thirst?: No  Musculoskeletal Back pain?: No Joint pain?: No  Neurological Headaches?: Yes Dizziness?: No  Psychologic Depression?: Yes Anxiety?: Yes  Physical Exam: BP (!) 146/87 (BP  Location: Right Arm, Patient Position: Sitting, Cuff Size: Large)   Pulse 78   Ht 4\' 11"  (1.499 m)   Wt 143 lb 12.8 oz (65.2 kg)   BMI 29.04 kg/m   Constitutional: Well nourished. Alert and oriented, No acute distress. HEENT: [place] AT, moist mucus membranes. Trachea midline, no masses. Cardiovascular: No clubbing, cyanosis, or edema. Respiratory: Normal respiratory effort, no increased work of breathing. GI: Abdomen is soft, non tender, non distended, no abdominal masses. Liver and spleen not palpable.  No hernias appreciated.  Stool sample for occult testing is not indicated.   GU: No CVA tenderness.  No bladder fullness or masses.  Atrophic external genitalia, normal pubic hair distribution, no lesions.  Normal urethral meatus, no lesions, no prolapse, no discharge.   Urethral caruncle is noted.  No bladder fullness, tenderness or masses. Pale vagina mucosa, poor estrogen effect, no discharge, no lesions, good pelvic support, no cystocele or rectocele noted.  No cervical motion tenderness.  Uterus is freely mobile and non-fixed.  No adnexal/parametria masses or tenderness noted.  Anus and perineum are without rashes or lesions.    Skin: No rashes, bruises or suspicious lesions. Lymph: No cervical or inguinal adenopathy. Neurologic: Grossly  intact, no focal deficits, moving all 4 extremities. Psychiatric: Normal mood and affect.    Pertinent Imaging: Results for LONIE, RUMMELL (MRN [id number]) as of 02/07/2018 10:56  Ref. Range 02/07/2018 10:30  Scan Result Unknown 0    Assessment & Plan:    1. Urge incontinence Patient to continue to decrease her coffee intake Patient encouraged to increase her water intake Urge incontinence is controlled with Myrbetriq 50 mg daily BLADDER SCAN AMB NON-IMAGING RTC in 12 months for OAB questionnaire and PVR  2. SUI Offered referral to physical therapy, but patient states she has transportation issues as her daughter is her care provider and is currently having medical issues of her own  3. Atrophic vaginitis advised patient to use coconut/olive oil for vaginal discomfort had a sample of Premarin for her today RTC in one year  Return in about 1 year (around 02/08/2019) for OAB questionnaire, PVR and exam.  These notes generated with voice recognition software. I apologize for typographical errors.  Zara Council, PA-C  [facility] [address] [place], [place] [address] [phone]

## 2018-02-07 ENCOUNTER — Ambulatory Visit (INDEPENDENT_AMBULATORY_CARE_PROVIDER_SITE_OTHER): Payer: Medicare Other | Admitting: Urology

## 2018-02-07 ENCOUNTER — Encounter: Payer: Self-pay | Admitting: Urology

## 2018-02-07 VITALS — BP 146/87 | HR 78 | Ht 59.0 in | Wt 143.8 lb

## 2018-02-07 DIAGNOSIS — N952 Postmenopausal atrophic vaginitis: Secondary | ICD-10-CM | POA: Diagnosis not present

## 2018-02-07 DIAGNOSIS — N3941 Urge incontinence: Secondary | ICD-10-CM | POA: Diagnosis not present

## 2018-02-07 DIAGNOSIS — I6522 Occlusion and stenosis of left carotid artery: Secondary | ICD-10-CM | POA: Diagnosis not present

## 2018-02-07 LAB — BLADDER SCAN AMB NON-IMAGING: SCAN RESULT: 0

## 2018-08-22 ENCOUNTER — Other Ambulatory Visit: Payer: Self-pay | Admitting: Urology

## 2018-10-21 ENCOUNTER — Encounter (INDEPENDENT_AMBULATORY_CARE_PROVIDER_SITE_OTHER): Payer: Medicare Other

## 2018-10-21 ENCOUNTER — Ambulatory Visit (INDEPENDENT_AMBULATORY_CARE_PROVIDER_SITE_OTHER): Payer: Medicare Other | Admitting: Vascular Surgery

## 2018-11-14 ENCOUNTER — Encounter (INDEPENDENT_AMBULATORY_CARE_PROVIDER_SITE_OTHER): Payer: Medicare Other

## 2018-11-14 ENCOUNTER — Ambulatory Visit (INDEPENDENT_AMBULATORY_CARE_PROVIDER_SITE_OTHER): Payer: Medicare Other | Admitting: Vascular Surgery

## 2018-12-19 ENCOUNTER — Encounter (INDEPENDENT_AMBULATORY_CARE_PROVIDER_SITE_OTHER): Payer: Medicare Other

## 2018-12-19 ENCOUNTER — Ambulatory Visit (INDEPENDENT_AMBULATORY_CARE_PROVIDER_SITE_OTHER): Payer: Medicare Other | Admitting: Vascular Surgery

## 2019-02-12 ENCOUNTER — Ambulatory Visit: Payer: Medicare Other | Admitting: Urology

## 2019-02-13 ENCOUNTER — Ambulatory Visit: Payer: Medicare Other | Admitting: Urology

## 2019-02-17 ENCOUNTER — Ambulatory Visit: Payer: Medicare Other | Admitting: Urology

## 2019-02-18 ENCOUNTER — Ambulatory Visit: Payer: Medicare Other | Admitting: Urology

## 2019-02-26 ENCOUNTER — Other Ambulatory Visit (INDEPENDENT_AMBULATORY_CARE_PROVIDER_SITE_OTHER): Payer: Self-pay | Admitting: Vascular Surgery

## 2019-02-26 DIAGNOSIS — I6523 Occlusion and stenosis of bilateral carotid arteries: Secondary | ICD-10-CM

## 2019-02-26 NOTE — Progress Notes (Signed)
MRN : [id number]  Jody Wagner is a 85 y.o. ([birth year]) female who presents with chief complaint of No chief complaint on file. Marland Kitchen  History of Present Illness:   The patient is seen for follow up evaluation of carotid stenosis. The carotid stenosis followed by ultrasound.   The patient denies amaurosis fugax. There is no recent history of TIA symptoms or focal motor deficits. There is no prior documented CVA.  The patient is taking enteric-coated aspirin 81 mg daily.  There is no history of migraine headaches. There is no history of seizures.  The patient has a history of coronary artery disease, no recent episodes of angina or shortness of breath. The patient denies PAD or claudication symptoms. There is a history of hyperlipidemia which is being treated with a statin.   Carotid Duplex done today shows RICA <32% and LICA 44-01%.  No significant change compared to last study   No outpatient medications have been marked as taking for the 02/27/19 encounter (Appointment) with Delana Meyer, Dolores Lory, MD.    Past Medical History:  Diagnosis Date  . Acid reflux   . Allergic rhinitis   . Amputation finger    as a child  . Anxiety   . Arthritis   . Asthma   . Atrophic vaginitis   . Cancer (HCC)    squamous cell -on cheek  . COPD (chronic obstructive pulmonary disease) ([place])   . Depression   . Dyspnea    with exertion/ occasional wheezing  . Dysuria   . Heart murmur   . Heartburn   . HLD (hyperlipidemia)   . HOH (hard of hearing)   . HTN (hypertension)   . Nocturia   . Obesity   . PONV (postoperative nausea and vomiting)   . Popliteal pain   . Restless leg syndrome   . Rheumatic fever 1953   hx of/ damaged valves/ scared  . RLS (restless legs syndrome)   . Shingles    hx of  . Urge incontinence   . Urinary frequency   . Urinary incontinence   . Urinary urgency   . Wears dentures    upper    Past Surgical History:  Procedure Laterality Date  . arm surgery  Left 2013,2014   pulled from socket  . CATARACT EXTRACTION W/PHACO Left 07/10/2017   Procedure: CATARACT EXTRACTION PHACO AND INTRAOCULAR LENS PLACEMENT (IOC) LEFT;  Surgeon: Eulogio Bear, MD;  Location: [facility];  Service: Ophthalmology;  Laterality: Left;  . CATARACT EXTRACTION W/PHACO Right 08/07/2017   Procedure: CATARACT EXTRACTION PHACO AND INTRAOCULAR LENS PLACEMENT ([place]) RIGHT;  Surgeon: Eulogio Bear, MD;  Location: [facility];  Service: Ophthalmology;  Laterality: Right;  . rheumatic fever  1953   damaged valves  . SHOULDER SURGERY Left   . skin cancer removal  08/2015   squamous cell/ MOHS  . WRIST SURGERY Left 2003    Social History Social History   Tobacco Use  . Smoking status: Former Smoker    Packs/day: 1.00    Types: Cigarettes  . Smokeless tobacco: Never Used  . Tobacco comment: quit 15years  Substance Use Topics  . Alcohol use: No    Alcohol/week: 0.0 standard drinks  . Drug use: No    Family History Family History  Problem Relation Age of Onset  . Heart disease Unknown   . Prostate cancer Father   . Prostate cancer Brother   . Lung cancer Sister   . Cancer Sister  intestines  . Kidney cancer Neg Hx   . Bladder Cancer Neg Hx   . Kidney disease Neg Hx   . Breast cancer Neg Hx     Allergies  Allergen Reactions  . Other Swelling    Vomit   "Wild Mushrooms" vomit  . Lovastatin Other (See Comments)  . Latex Itching    Only to bandaids     REVIEW OF SYSTEMS (Negative unless checked)  Constitutional: [] Weight loss  [] Fever  [] Chills Cardiac: [] Chest pain   [] Chest pressure   [] Palpitations   [] Shortness of breath when laying flat   [] Shortness of breath with exertion. Vascular:  [] Pain in legs with walking   [] Pain in legs at rest  [] History of DVT   [] Phlebitis   [] Swelling in legs   [] Varicose veins   [] Non-healing ulcers Pulmonary:   [] Uses home oxygen   [] Productive cough   [] Hemoptysis   [] Wheeze  [] COPD    [] Asthma Neurologic:  [] Dizziness   [] Seizures   [] History of stroke   [] History of TIA  [] Aphasia   [] Vissual changes   [] Weakness or numbness in arm   [] Weakness or numbness in leg Musculoskeletal:   [] Joint swelling   [] Joint pain   [] Low back pain Hematologic:  [] Easy bruising  [] Easy bleeding   [] Hypercoagulable state   [] Anemic Gastrointestinal:  [] Diarrhea   [] Vomiting  [] Gastroesophageal reflux/heartburn   [] Difficulty swallowing. Genitourinary:  [] Chronic kidney disease   [] Difficult urination  [] Frequent urination   [] Blood in urine Skin:  [] Rashes   [] Ulcers  Psychological:  [] History of anxiety   []  History of major depression.  Physical Examination  There were no vitals filed for this visit. There is no height or weight on file to calculate BMI. Gen: WD/WN, NAD Head: [place]/AT, No temporalis wasting.  Ear/Nose/Throat: Hearing grossly intact, nares w/o erythema or drainage Eyes: PER, EOMI, sclera nonicteric.  Neck: Supple, no large masses.   Pulmonary:  Good air movement, no audible wheezing bilaterally, no use of accessory muscles.  Cardiac: RRR, no JVD Vascular:  Bilateral carotid bruits Vessel Right Left  Radial Palpable Palpable  Brachial Palpable Palpable  Carotid Palpable Palpable  Gastrointestinal: Non-distended. No guarding/no peritoneal signs.  Musculoskeletal: M/S 5/5 throughout.  No deformity or atrophy.  Neurologic: CN 2-12 intact. Symmetrical.  Speech is fluent. Motor exam as listed above. Psychiatric: Judgment intact, Mood & affect appropriate for pt's clinical situation. Dermatologic: No rashes or ulcers noted.  No changes consistent with cellulitis. Lymph : No lichenification or skin changes of chronic lymphedema.  CBC No results found for: WBC, HGB, HCT, MCV, PLT  BMET No results found for: NA, K, CL, CO2, GLUCOSE, BUN, CREATININE, CALCIUM, GFRNONAA, GFRAA CrCl cannot be calculated (No successful lab value found.).  COAG No results found for: INR,  PROTIME  Radiology No results found.   Assessment/Plan 1. Stenosis of left carotid artery Recommend:  Given the patient's asymptomatic subcritical stenosis no further invasive testing or surgery at this time.  Carotid Duplex done today shows RICA <51% and LICA 02-58%.  Mild change of the LICA compared to last study in 10/12/2016  Continue antiplatelet therapy as prescribed Continue management of CAD, HTN and Hyperlipidemia Healthy heart diet,  encouraged exercise at least 4 times per week Follow up in 12 months with duplex ultrasound and physical exam based  - VAS US CAROTID; Future  2. Essential hypertension Continue antihypertensive medications as already ordered, these medications have been reviewed and there are no changes at this time.  3. Pulmonary emphysema, unspecified emphysema type ([place]) Continue pulmonary medications and aerosols as already ordered, these medications have been reviewed and there are no changes at this time.     Hortencia Pilar, MD  02/26/2019 8:44 AM

## 2019-02-27 ENCOUNTER — Ambulatory Visit (INDEPENDENT_AMBULATORY_CARE_PROVIDER_SITE_OTHER): Payer: Medicare Other

## 2019-02-27 ENCOUNTER — Other Ambulatory Visit: Payer: Self-pay

## 2019-02-27 ENCOUNTER — Encounter (INDEPENDENT_AMBULATORY_CARE_PROVIDER_SITE_OTHER): Payer: Self-pay | Admitting: Vascular Surgery

## 2019-02-27 ENCOUNTER — Ambulatory Visit (INDEPENDENT_AMBULATORY_CARE_PROVIDER_SITE_OTHER): Payer: Medicare Other | Admitting: Vascular Surgery

## 2019-02-27 VITALS — BP 137/68 | HR 78 | Resp 16 | Ht 59.0 in | Wt 142.4 lb

## 2019-02-27 DIAGNOSIS — Z87891 Personal history of nicotine dependence: Secondary | ICD-10-CM | POA: Diagnosis not present

## 2019-02-27 DIAGNOSIS — I6523 Occlusion and stenosis of bilateral carotid arteries: Secondary | ICD-10-CM | POA: Diagnosis not present

## 2019-02-27 DIAGNOSIS — J439 Emphysema, unspecified: Secondary | ICD-10-CM

## 2019-02-27 DIAGNOSIS — I6522 Occlusion and stenosis of left carotid artery: Secondary | ICD-10-CM

## 2019-02-27 DIAGNOSIS — I10 Essential (primary) hypertension: Secondary | ICD-10-CM

## 2019-02-27 DIAGNOSIS — Z7982 Long term (current) use of aspirin: Secondary | ICD-10-CM

## 2019-03-30 DIAGNOSIS — E785 Hyperlipidemia, unspecified: Secondary | ICD-10-CM | POA: Insufficient documentation

## 2019-03-30 DIAGNOSIS — M810 Age-related osteoporosis without current pathological fracture: Secondary | ICD-10-CM | POA: Insufficient documentation

## 2019-03-30 DIAGNOSIS — H905 Unspecified sensorineural hearing loss: Secondary | ICD-10-CM | POA: Insufficient documentation

## 2019-03-30 DIAGNOSIS — K219 Gastro-esophageal reflux disease without esophagitis: Secondary | ICD-10-CM | POA: Insufficient documentation

## 2019-03-30 NOTE — Progress Notes (Signed)
03/31/2019 4:02 PM   Lance Bosch 02-17-1931 [id number]  Referring provider: Ezequiel Kayser, MD [facility] [address],  [place] [address]  Chief Complaint  Patient presents with  . Urinary Incontinence    HPI: Patient is an 85 year old female with urge incontinence and atrophic vaginitis who presents today for follow up.  Urge incontinence She is experiencing urgency x 0-3 (stable), frequency x 4-7 (stable), still limiting fluids to reduce bathroom trips, still engages in toilet mapping, incontinence x 0-3 (improved) and nocturia x 0-3 (stable).  Patient denies any gross hematuria, dysuria or suprapubic/flank pain.  Patient denies any fevers, chills, nausea or vomiting. She is not had dysuria, gross hematuria or suprapubic pain.  Denies any fevers, chills, nausea or vomiting.  Her PVR today is 0 mL.  Her BP is 130/72.   Vaginal atrophy She is using vaginal estrogen cream three nights weekly when she has the samples as she is reliant on the samples.      PMH: Past Medical History:  Diagnosis Date  . Acid reflux   . Allergic rhinitis   . Amputation finger    as a child  . Anxiety   . Arthritis   . Asthma   . Atrophic vaginitis   . Cancer (HCC)    squamous cell -on cheek  . COPD (chronic obstructive pulmonary disease) ([place])   . Depression   . Dyspnea    with exertion/ occasional wheezing  . Dysuria   . Heart murmur   . Heartburn   . HLD (hyperlipidemia)   . HOH (hard of hearing)   . HTN (hypertension)   . Nocturia   . Obesity   . PONV (postoperative nausea and vomiting)   . Popliteal pain   . Restless leg syndrome   . Rheumatic fever 1953   hx of/ damaged valves/ scared  . RLS (restless legs syndrome)   . Shingles    hx of  . Urge incontinence   . Urinary frequency   . Urinary incontinence   . Urinary urgency   . Wears dentures    upper    Surgical History: Past Surgical History:  Procedure Laterality Date  . arm surgery Left  2013,2014   pulled from socket  . CATARACT EXTRACTION W/PHACO Left 07/10/2017   Procedure: CATARACT EXTRACTION PHACO AND INTRAOCULAR LENS PLACEMENT (IOC) LEFT;  Surgeon: Eulogio Bear, MD;  Location: [facility];  Service: Ophthalmology;  Laterality: Left;  . CATARACT EXTRACTION W/PHACO Right 08/07/2017   Procedure: CATARACT EXTRACTION PHACO AND INTRAOCULAR LENS PLACEMENT ([place]) RIGHT;  Surgeon: Eulogio Bear, MD;  Location: [facility];  Service: Ophthalmology;  Laterality: Right;  . rheumatic fever  1953   damaged valves  . SHOULDER SURGERY Left   . skin cancer removal  08/2015   squamous cell/ MOHS  . WRIST SURGERY Left 2003    Home Medications:  Allergies as of 03/31/2019      Reactions   Other Swelling   Vomit   "Wild Mushrooms" vomit   Lovastatin Other (See Comments)   Latex Itching   Only to bandaids      Medication List       Accurate as of March 31, 2019 11:59 PM. If you have any questions, ask your nurse or doctor.        STOP taking these medications   Durezol 0.05 % Emul Generic drug: Difluprednate Stopped by: Kathe Wirick, PA-C   OPTIVE 0.5-0.9 % ophthalmic  solution Generic drug: carboxymethylcellul-glycerin Stopped by: Zara Council, PA-C     TAKE these medications   aspirin EC 81 MG tablet Take by mouth.   cetirizine 10 MG tablet Commonly known as: ZYRTEC Take by mouth. Reported on 11/22/2015   conjugated estrogens vaginal cream Commonly known as: PREMARIN Place 1 Applicatorful at bedtime vaginally. Amount(size of pea)   cyanocobalamin 100 MCG tablet Take 100 mcg by mouth daily.   fluticasone 50 MCG/ACT nasal spray Commonly known as: FLONASE Place 2 sprays daily as needed into the nose.   HYDROcodone-acetaminophen 5-325 MG tablet Commonly known as: NORCO/VICODIN hydrocodone 5 mg-acetaminophen 325 mg tablet   losartan 100 MG tablet Commonly known as: COZAAR Take by mouth.   Myrbetriq 50 MG Tb24 tablet  Generic drug: mirabegron ER TAKE (1) TABLET BY MOUTH EVERY DAY   omeprazole 20 MG capsule Commonly known as: PRILOSEC Take 2 (two) times daily by mouth. Am and bedtime   ProAir HFA 108 (90 Base) MCG/ACT inhaler Generic drug: albuterol Inhale into the lungs.   Requip 0.5 MG tablet Generic drug: rOPINIRole Take at bedtime by mouth.   sertraline 50 MG tablet Commonly known as: ZOLOFT Take at bedtime by mouth.   Systane 0.4-0.3 % Gel ophthalmic gel Generic drug: Polyethyl Glycol-Propyl Glycol Place 1 application as needed into both eyes (at bedtime).   SYSTANE BALANCE OP Apply to eye as needed.   TYLENOL 500 MG tablet Generic drug: acetaminophen Take by mouth.   Vitamin D3 50 MCG (2000 UT) capsule Take by mouth.       Allergies:  Allergies  Allergen Reactions  . Other Swelling    Vomit   "Wild Mushrooms" vomit  . Lovastatin Other (See Comments)  . Latex Itching    Only to bandaids    Family History: Family History  Problem Relation Age of Onset  . Heart disease Other   . Prostate cancer Father   . Prostate cancer Brother   . Lung cancer Sister   . Cancer Sister        intestines  . Kidney cancer Neg Hx   . Bladder Cancer Neg Hx   . Kidney disease Neg Hx   . Breast cancer Neg Hx     Social History:  reports that she has quit smoking. Her smoking use included cigarettes. She smoked 1.00 pack per day. She has never used smokeless tobacco. She reports that she does not drink alcohol or use drugs.  ROS: UROLOGY Frequent Urination?: No Hard to postpone urination?: No Burning/pain with urination?: No Get up at night to urinate?: No Leakage of urine?: Yes Urine stream starts and stops?: No Trouble starting stream?: No Do you have to strain to urinate?: No Blood in urine?: No Urinary tract infection?: No Sexually transmitted disease?: No Injury to kidneys or bladder?: No Painful intercourse?: No Weak stream?: No Currently pregnant?: No Vaginal  bleeding?: No Last menstrual period?: N/A  Gastrointestinal Nausea?: No Vomiting?: No Indigestion/heartburn?: Yes Diarrhea?: Yes Constipation?: No  Constitutional Fever: No Night sweats?: No Weight loss?: No Fatigue?: No  Skin Skin rash/lesions?: No Itching?: No  Eyes Blurred vision?: No Double vision?: No  Ears/Nose/Throat Sore throat?: No Sinus problems?: Yes  Hematologic/Lymphatic Swollen glands?: No Easy bruising?: Yes  Cardiovascular Leg swelling?: Yes Chest pain?: No  Respiratory Cough?: No Shortness of breath?: Yes  Endocrine Excessive thirst?: No  Musculoskeletal Back pain?: No Joint pain?: Yes  Neurological Headaches?: No Dizziness?: No  Psychologic Depression?: Yes Anxiety?: Yes  Physical Exam: BP  130/72 (BP Location: Left Arm, Patient Position: Sitting, Cuff Size: Normal)   Pulse 86   Ht 4\' 11"  (1.499 m)   Wt 142 lb (64.4 kg)   BMI 28.68 kg/m   Constitutional:  Well nourished. Alert and oriented, No acute distress. HEENT: [place] AT, moist mucus membranes.  Trachea midline, no masses. Cardiovascular: No clubbing, cyanosis, or edema. Respiratory: Normal respiratory effort, no increased work of breathing. Neurologic: Grossly intact, no focal deficits, moving all 4 extremities. Psychiatric: Normal mood and affect.    Pertinent Imaging: Results for ADIANNA, DARWIN (MRN [id number]) as of 04/11/2019 16:00  Ref. Range 03/31/2019 13:43  Scan Result Unknown 78mL     Assessment & Plan:    1. Urge incontinence Improving RTC in one year for OAB questionnaire and PVR  2. SUI Not bothersome   3. Atrophic vaginitis Given Premarin sample at today's visit RTC in one year for exam   Return in about 1 year (around 03/30/2020) for OAB questionnaire, PVR and exam.  These notes generated with voice recognition software. I apologize for typographical errors.  Zara Council, PA-C  [facility] [address] [place], [place] [address] [phone]

## 2019-03-31 ENCOUNTER — Encounter: Payer: Self-pay | Admitting: Urology

## 2019-03-31 ENCOUNTER — Ambulatory Visit (INDEPENDENT_AMBULATORY_CARE_PROVIDER_SITE_OTHER): Payer: Medicare Other | Admitting: Urology

## 2019-03-31 ENCOUNTER — Other Ambulatory Visit: Payer: Self-pay

## 2019-03-31 VITALS — BP 130/72 | HR 86 | Ht 59.0 in | Wt 142.0 lb

## 2019-03-31 DIAGNOSIS — N3941 Urge incontinence: Secondary | ICD-10-CM

## 2019-03-31 DIAGNOSIS — N393 Stress incontinence (female) (male): Secondary | ICD-10-CM

## 2019-03-31 DIAGNOSIS — S43016A Anterior dislocation of unspecified humerus, initial encounter: Secondary | ICD-10-CM | POA: Insufficient documentation

## 2019-03-31 DIAGNOSIS — I6523 Occlusion and stenosis of bilateral carotid arteries: Secondary | ICD-10-CM | POA: Diagnosis not present

## 2019-03-31 DIAGNOSIS — N952 Postmenopausal atrophic vaginitis: Secondary | ICD-10-CM | POA: Diagnosis not present

## 2019-03-31 DIAGNOSIS — S42253A Displaced fracture of greater tuberosity of unspecified humerus, initial encounter for closed fracture: Secondary | ICD-10-CM | POA: Insufficient documentation

## 2019-03-31 LAB — BLADDER SCAN AMB NON-IMAGING

## 2019-04-01 ENCOUNTER — Ambulatory Visit: Payer: Medicare Other | Admitting: Urology

## 2019-07-16 DIAGNOSIS — R251 Tremor, unspecified: Secondary | ICD-10-CM | POA: Insufficient documentation

## 2019-07-16 DIAGNOSIS — G3184 Mild cognitive impairment, so stated: Secondary | ICD-10-CM | POA: Insufficient documentation

## 2019-09-19 ENCOUNTER — Telehealth: Payer: Self-pay | Admitting: Urology

## 2019-09-19 NOTE — Telephone Encounter (Signed)
Spoke to patient and informed her it is ok to stop the Mybetriq. She states she is having a hard time remembering to take some of her medicines and it would be best if she doesn't take it. I informed her to just contact us if she needs to be seen for any urinary problems.  Patient voiced understanding.

## 2019-09-19 NOTE — Telephone Encounter (Signed)
Pt called office and request that Larene Beach take her off of Myrbetriq.  She has decided she is not going to go back on this.

## 2019-10-11 ENCOUNTER — Ambulatory Visit: Payer: Medicare Other | Attending: Internal Medicine

## 2019-10-11 DIAGNOSIS — Z23 Encounter for immunization: Secondary | ICD-10-CM | POA: Insufficient documentation

## 2019-10-11 NOTE — Progress Notes (Signed)
   Covid-19 Vaccination Clinic  Name:  Jody Wagner    MRN: [id number] DOB: [birth year]  10/11/2019  [name] was observed post Covid-19 immunization for 15 minutes without incidence. She was provided with Vaccine Information Sheet and instruction to access the V-Safe system.   [name] was instructed to call 911 with any severe reactions post vaccine: Marland Kitchen Difficulty breathing  . Swelling of your face and throat  . A fast heartbeat  . A bad rash all over your body  . Dizziness and weakness    Immunizations Administered    Name Date Dose VIS Date Route   Pfizer COVID-19 Vaccine 10/11/2019  9:51 AM 0.3 mL 08/08/2019 Intramuscular   Manufacturer: [place]   Lot: [id number]   [place]: [id number]

## 2019-11-01 ENCOUNTER — Ambulatory Visit: Payer: Medicare Other | Attending: Internal Medicine

## 2019-11-01 DIAGNOSIS — Z23 Encounter for immunization: Secondary | ICD-10-CM

## 2019-11-01 NOTE — Progress Notes (Signed)
   Covid-19 Vaccination Clinic  Name:  Jody Wagner    MRN: [id number] DOB: 1930-11-06  11/01/2019  [name] was observed post Covid-19 immunization for 15 minutes without incident. She was provided with Vaccine Information Sheet and instruction to access the V-Safe system.   [name] was instructed to call 911 with any severe reactions post vaccine: Marland Kitchen Difficulty breathing  . Swelling of face and throat  . A fast heartbeat  . A bad rash all over body  . Dizziness and weakness   Immunizations Administered    Name Date Dose VIS Date Route   Pfizer COVID-19 Vaccine 11/01/2019  9:50 AM 0.3 mL 08/08/2019 Intramuscular   Manufacturer: [place]   Lot: [id number]   [place]: [id number]

## 2020-02-04 DIAGNOSIS — Z89022 Acquired absence of left finger(s): Secondary | ICD-10-CM | POA: Insufficient documentation

## 2020-02-04 DIAGNOSIS — D696 Thrombocytopenia, unspecified: Secondary | ICD-10-CM | POA: Insufficient documentation

## 2020-02-27 DIAGNOSIS — H9193 Unspecified hearing loss, bilateral: Secondary | ICD-10-CM | POA: Insufficient documentation

## 2020-02-27 DIAGNOSIS — R413 Other amnesia: Secondary | ICD-10-CM | POA: Insufficient documentation

## 2020-03-04 ENCOUNTER — Ambulatory Visit (INDEPENDENT_AMBULATORY_CARE_PROVIDER_SITE_OTHER): Payer: Medicare Other

## 2020-03-04 ENCOUNTER — Ambulatory Visit (INDEPENDENT_AMBULATORY_CARE_PROVIDER_SITE_OTHER): Payer: Medicare Other | Admitting: Vascular Surgery

## 2020-03-04 ENCOUNTER — Other Ambulatory Visit: Payer: Self-pay

## 2020-03-04 ENCOUNTER — Encounter (INDEPENDENT_AMBULATORY_CARE_PROVIDER_SITE_OTHER): Payer: Self-pay | Admitting: Vascular Surgery

## 2020-03-04 VITALS — BP 152/69 | HR 69 | Ht 59.0 in | Wt 138.0 lb

## 2020-03-04 DIAGNOSIS — I10 Essential (primary) hypertension: Secondary | ICD-10-CM

## 2020-03-04 DIAGNOSIS — K219 Gastro-esophageal reflux disease without esophagitis: Secondary | ICD-10-CM

## 2020-03-04 DIAGNOSIS — J439 Emphysema, unspecified: Secondary | ICD-10-CM | POA: Diagnosis not present

## 2020-03-04 DIAGNOSIS — I6523 Occlusion and stenosis of bilateral carotid arteries: Secondary | ICD-10-CM

## 2020-03-04 DIAGNOSIS — E782 Mixed hyperlipidemia: Secondary | ICD-10-CM | POA: Diagnosis not present

## 2020-03-04 DIAGNOSIS — I6522 Occlusion and stenosis of left carotid artery: Secondary | ICD-10-CM

## 2020-03-04 NOTE — Progress Notes (Signed)
MRN : [id number]  Jody Wagner is a 86 y.o. (1930-12-20) female who presents with chief complaint of  Chief Complaint  Patient presents with  . Follow-up    U/S folllow up  .  History of Present Illness:   The patient is seen for follow up evaluation of carotid stenosis. The carotid stenosis followed by ultrasound.   The patient denies amaurosis fugax. There is no recent history of TIA symptoms or focal motor deficits. There is no prior documented CVA.  The patient is taking enteric-coated aspirin 81 mg daily.  There is no history of migraine headaches. There is no history of seizures.  The patient has a history of coronary artery disease, no recent episodes of angina or shortness of breath. The patient denies PAD or claudication symptoms. There is a history of hyperlipidemia which is being treated with a statin.   Carotid Duplex done today showsRICA <89% and LICA 21-19%.No significant change compared to last study   Current Meds  Medication Sig  . acetaminophen (TYLENOL) 500 MG tablet Take by mouth.  Marland Kitchen aspirin EC 81 MG tablet Take by mouth.  . cetirizine (ZYRTEC) 10 MG tablet Take by mouth. Reported on 11/22/2015  . Cholecalciferol (VITAMIN D3) 2000 units capsule Take by mouth.  . cyanocobalamin 100 MCG tablet Take 100 mcg by mouth daily.  . fluticasone (FLONASE) 50 MCG/ACT nasal spray Place 2 sprays daily as needed into the nose.   Marland Kitchen omeprazole (PRILOSEC) 20 MG capsule Take 2 (two) times daily by mouth. Am and bedtime  . Polyethyl Glycol-Propyl Glycol (SYSTANE) 0.4-0.3 % GEL ophthalmic gel Place 1 application as needed into both eyes (at bedtime).   Marland Kitchen rOPINIRole (REQUIP) 0.5 MG tablet Take at bedtime by mouth.   . sertraline (ZOLOFT) 50 MG tablet Take at bedtime by mouth.     Past Medical History:  Diagnosis Date  . Acid reflux   . Allergic rhinitis   . Amputation finger    as a child  . Anxiety   . Arthritis   . Asthma   . Atrophic vaginitis   . Cancer  (HCC)    squamous cell -on cheek  . COPD (chronic obstructive pulmonary disease) ([place])   . Depression   . Dyspnea    with exertion/ occasional wheezing  . Dysuria   . Heart murmur   . Heartburn   . HLD (hyperlipidemia)   . HOH (hard of hearing)   . HTN (hypertension)   . Nocturia   . Obesity   . PONV (postoperative nausea and vomiting)   . Popliteal pain   . Restless leg syndrome   . Rheumatic fever 1953   hx of/ damaged valves/ scared  . RLS (restless legs syndrome)   . Shingles    hx of  . Urge incontinence   . Urinary frequency   . Urinary incontinence   . Urinary urgency   . Wears dentures    upper    Past Surgical History:  Procedure Laterality Date  . arm surgery Left 2013,2014   pulled from socket  . CATARACT EXTRACTION W/PHACO Left 07/10/2017   Procedure: CATARACT EXTRACTION PHACO AND INTRAOCULAR LENS PLACEMENT (IOC) LEFT;  Surgeon: Eulogio Bear, MD;  Location: [facility];  Service: Ophthalmology;  Laterality: Left;  . CATARACT EXTRACTION W/PHACO Right 08/07/2017   Procedure: CATARACT EXTRACTION PHACO AND INTRAOCULAR LENS PLACEMENT ([place]) RIGHT;  Surgeon: Eulogio Bear, MD;  Location: [facility];  Service: Ophthalmology;  Laterality: Right;  .  rheumatic fever  1953   damaged valves  . SHOULDER SURGERY Left   . skin cancer removal  08/2015   squamous cell/ MOHS  . WRIST SURGERY Left 2003    Social History Social History   Tobacco Use  . Smoking status: Former Smoker    Packs/day: 1.00    Types: Cigarettes  . Smokeless tobacco: Never Used  . Tobacco comment: quit 15years  Substance Use Topics  . Alcohol use: No    Alcohol/week: 0.0 standard drinks  . Drug use: No    Family History Family History  Problem Relation Age of Onset  . Heart disease Other   . Prostate cancer Father   . Prostate cancer Brother   . Lung cancer Sister   . Cancer Sister        intestines  . Kidney cancer Neg Hx   . Bladder Cancer Neg Hx     . Kidney disease Neg Hx   . Breast cancer Neg Hx   No family history of bleeding/clotting disorders, porphyria or autoimmune disease   Allergies  Allergen Reactions  . Other Swelling    Vomit   "Wild Mushrooms" vomit  . Lovastatin Other (See Comments)  . Latex Itching    Only to bandaids     REVIEW OF SYSTEMS (Negative unless checked)  Constitutional: [] Weight loss  [] Fever  [] Chills Cardiac: [] Chest pain   [] Chest pressure   [] Palpitations   [] Shortness of breath when laying flat   [] Shortness of breath with exertion. Vascular:  [] Pain in legs with walking   [] Pain in legs at rest  [] History of DVT   [] Phlebitis   [] Swelling in legs   [] Varicose veins   [] Non-healing ulcers Pulmonary:   [] Uses home oxygen   [] Productive cough   [] Hemoptysis   [] Wheeze  [] COPD   [] Asthma Neurologic:  [] Dizziness   [] Seizures   [] History of stroke   [] History of TIA  [] Aphasia   [] Vissual changes   [] Weakness or numbness in arm   [] Weakness or numbness in leg Musculoskeletal:   [] Joint swelling   [] Joint pain   [] Low back pain Hematologic:  [] Easy bruising  [] Easy bleeding   [] Hypercoagulable state   [] Anemic Gastrointestinal:  [] Diarrhea   [] Vomiting  [] Gastroesophageal reflux/heartburn   [] Difficulty swallowing. Genitourinary:  [] Chronic kidney disease   [] Difficult urination  [] Frequent urination   [] Blood in urine Skin:  [] Rashes   [] Ulcers  Psychological:  [] History of anxiety   []  History of major depression.  Physical Examination  Vitals:   03/04/20 1121  BP: (!) 152/69  Pulse: 69  Weight: 138 lb (62.6 kg)  Height: 4\' 11"  (1.499 m)   Body mass index is 27.87 kg/m. Gen: WD/WN, NAD Head: [place]/AT, No temporalis wasting.  Ear/Nose/Throat: Hearing grossly intact, nares w/o erythema or drainage, poor dentition Eyes: PER, EOMI, sclera nonicteric.  Neck: Supple, no masses.  No bruit or JVD.  Pulmonary:  Good air movement, clear to auscultation bilaterally, no use of accessory muscles.   Cardiac: RRR, normal S1, S2, no Murmurs. Vascular:  Bilateral carotid bruits Vessel Right Left  Radial Palpable Palpable  Brachial Palpable Palpable  Carotid Palpable Palpable  Gastrointestinal: soft, non-distended. No guarding/no peritoneal signs.  Musculoskeletal: M/S 5/5 throughout.  No deformity or atrophy.  Neurologic: CN 2-12 intact. Pain and light touch intact in extremities.  Symmetrical.  Speech is fluent. Motor exam as listed above. Psychiatric: Judgment intact, Mood & affect appropriate for pt's clinical situation. Dermatologic: No rashes or ulcers  noted.  No changes consistent with cellulitis.  CBC No results found for: WBC, HGB, HCT, MCV, PLT  BMET No results found for: NA, K, CL, CO2, GLUCOSE, BUN, CREATININE, CALCIUM, GFRNONAA, GFRAA CrCl cannot be calculated (No successful lab value found.).  COAG No results found for: INR, PROTIME  Radiology No results found.   Assessment/Plan 1. Bilateral carotid artery stenosis Recommend:  Given the patient's asymptomatic subcritical stenosis no further invasive testing or surgery at this time.  Carotid Duplex done today showsRICA <62% and LICA 95-28%.Mildchange of the LICAcompared to last study   Continue antiplatelet therapy as prescribed Continue management of CAD, HTN and Hyperlipidemia Healthy heart diet, encouraged exercise at least 4 times per week Follow up in56months with duplex ultrasound and physical   - VAS US CAROTID; Future  2. Essential hypertension Continue antihypertensive medications as already ordered, these medications have been reviewed and there are no changes at this time.   3. Pulmonary emphysema, unspecified emphysema type ([place]) Continue pulmonary medications and aerosols as already ordered, these medications have been reviewed and there are no changes at this time.    4. Mixed hyperlipidemia Continue statin as ordered and reviewed, no changes at this time   5.  Gastroesophageal reflux disease without esophagitis Continue PPI as already ordered, this medication has been reviewed and there are no changes at this time.  Avoidence of caffeine and alcohol  Moderate elevation of the head of the bed     Hortencia Pilar, MD  03/04/2020 12:59 PM

## 2020-04-04 NOTE — Progress Notes (Signed)
04/05/2020 9:30 AM   Jody Wagner 1930-09-27 [id number]  Referring provider: Ezequiel Kayser, MD [facility] [place],  [place] [phone]  Chief Complaint  Patient presents with  . Follow-up    1 year follow-up    HPI: Patient is an 86 year old female with urge incontinence and atrophic vaginitis who presents today for follow up.  Urge incontinence She is experiencing urgency x 0-3 (stable), frequency x 4-7 (stable), still limiting fluids to reduce bathroom trips, still engages in toilet mapping, incontinence x 4-7 (worse) and nocturia x 0-3 (stable).  Patient denies any modifying or aggravating factors.  Patient denies any gross hematuria, dysuria or suprapubic/flank pain.  Patient denies any fevers, chills, nausea or vomiting.   Her PVR today is 33 mL.  Her BP is 163/80.   She has decreased her coffee intake and has increased her water intake.  She cannot give detailed amounts of the water she consumes.    Vaginal atrophy She is not applying the vaginal estrogen cream.  She is having vaginal burning and sometimes her perineal skin peels off.  She attributes this to wearing panty liners.  She is unclear how many panty liners she wears daily.   She is very frustrated with having so many doctor's appointment and having to take medication.   PMH: Past Medical History:  Diagnosis Date  . Acid reflux   . Allergic rhinitis   . Amputation finger    as a child  . Anxiety   . Arthritis   . Asthma   . Atrophic vaginitis   . Cancer (HCC)    squamous cell -on cheek  . COPD (chronic obstructive pulmonary disease) ([place])   . Depression   . Dyspnea    with exertion/ occasional wheezing  . Dysuria   . Heart murmur   . Heartburn   . HLD (hyperlipidemia)   . HOH (hard of hearing)   . HTN (hypertension)   . Nocturia   . Obesity   . PONV (postoperative nausea and vomiting)   . Popliteal pain   . Restless leg syndrome   . Rheumatic fever 1953   hx of/  damaged valves/ scared  . RLS (restless legs syndrome)   . Shingles    hx of  . Urge incontinence   . Urinary frequency   . Urinary incontinence   . Urinary urgency   . Wears dentures    upper    Surgical History: Past Surgical History:  Procedure Laterality Date  . arm surgery Left 2013,2014   pulled from socket  . CATARACT EXTRACTION W/PHACO Left 07/10/2017   Procedure: CATARACT EXTRACTION PHACO AND INTRAOCULAR LENS PLACEMENT (IOC) LEFT;  Surgeon: Eulogio Bear, MD;  Location: [facility];  Service: Ophthalmology;  Laterality: Left;  . CATARACT EXTRACTION W/PHACO Right 08/07/2017   Procedure: CATARACT EXTRACTION PHACO AND INTRAOCULAR LENS PLACEMENT ([place]) RIGHT;  Surgeon: Eulogio Bear, MD;  Location: [facility];  Service: Ophthalmology;  Laterality: Right;  . rheumatic fever  1953   damaged valves  . SHOULDER SURGERY Left   . skin cancer removal  08/2015   squamous cell/ MOHS  . WRIST SURGERY Left 2003    Home Medications:  Allergies as of 04/05/2020      Reactions   Other Swelling   Vomit   "Wild Mushrooms" vomit   Lovastatin Other (See Comments)   Latex Itching   Only to bandaids      Medication List  Accurate as of April 05, 2020  9:30 AM. If you have any questions, ask your nurse or doctor.        STOP taking these medications   conjugated estrogens vaginal cream Commonly known as: PREMARIN Stopped by: Zara Council, PA-C   HYDROcodone-acetaminophen 5-325 MG tablet Commonly known as: NORCO/VICODIN Stopped by: Colandra Ohanian, PA-C   Myrbetriq 50 MG Tb24 tablet Generic drug: mirabegron ER Stopped by: Larene [place] Yanilen Adamik, PA-C   SYSTANE BALANCE OP Stopped by: Zara Council, PA-C     TAKE these medications   aspirin EC 81 MG tablet Take by mouth.   cetirizine 10 MG tablet Commonly known as: ZYRTEC Take by mouth. Reported on 11/22/2015   cyanocobalamin 100 MCG tablet Take 100 mcg by mouth daily.   donepezil 5 MG  tablet Commonly known as: ARICEPT Take 5 mg by mouth daily.   fluticasone 50 MCG/ACT nasal spray Commonly known as: FLONASE Place 2 sprays daily as needed into the nose.   losartan 100 MG tablet Commonly known as: COZAAR Take by mouth.   omeprazole 20 MG capsule Commonly known as: PRILOSEC Take 2 (two) times daily by mouth. Am and bedtime   ProAir HFA 108 (90 Base) MCG/ACT inhaler Generic drug: albuterol Inhale into the lungs.   Requip 0.5 MG tablet Generic drug: rOPINIRole Take at bedtime by mouth.   sertraline 50 MG tablet Commonly known as: ZOLOFT Take at bedtime by mouth.   Systane 0.4-0.3 % Gel ophthalmic gel Generic drug: Polyethyl Glycol-Propyl Glycol Place 1 application as needed into both eyes (at bedtime).   TYLENOL 500 MG tablet Generic drug: acetaminophen Take by mouth.   Vitamin D3 50 MCG (2000 UT) capsule Take by mouth.       Allergies:  Allergies  Allergen Reactions  . Other Swelling    Vomit   "Wild Mushrooms" vomit  . Lovastatin Other (See Comments)  . Latex Itching    Only to bandaids    Family History: Family History  Problem Relation Age of Onset  . Heart disease Other   . Prostate cancer Father   . Prostate cancer Brother   . Lung cancer Sister   . Cancer Sister        intestines  . Kidney cancer Neg Hx   . Bladder Cancer Neg Hx   . Kidney disease Neg Hx   . Breast cancer Neg Hx     Social History:  reports that she has quit smoking. Her smoking use included cigarettes. She smoked 1.00 pack per day. She has never used smokeless tobacco. She reports that she does not drink alcohol and does not use drugs.  ROS: For pertinent review of systems please refer to history of present illness  Physical Exam: BP (!) 163/80   Pulse 74   Ht 4\' 11"  (1.499 m)   Wt 135 lb (61.2 kg)   BMI 27.27 kg/m   Constitutional:  Well nourished. Alert and oriented, No acute distress. HEENT: [place] AT, mask in place.  Trachea  midline Cardiovascular: No clubbing, cyanosis, or edema. Respiratory: Normal respiratory effort, no increased work of breathing. GI: Abdomen is soft, non tender, non distended, no abdominal masses. Liver and spleen not palpable.  No hernias appreciated.  Stool sample for occult testing is not indicated.   GU: No CVA tenderness.  No bladder fullness or masses.  Atrophic external genitalia, labia and clitoral hood are fused, sparse pubic hair distribution, no lesions.  Normal urethral meatus, no lesions, no prolapse, no discharge.  No urethral masses, tenderness and/or tenderness. No bladder fullness, tenderness or masses. Pale vagina mucosa, poor estrogen effect, no discharge, no lesions, fair pelvic support, grade I cystocele and no rectocele noted.  No cervical motion tenderness.  Uterus is freely mobile and non-fixed.  No adnexal/parametria masses or tenderness noted.  Anus and perineum are without rashes or lesions.    Skin: No rashes, bruises or suspicious lesions. Lymph: No cervical or inguinal adenopathy. Neurologic: Grossly intact, no focal deficits, moving all 4 extremities. Psychiatric: Normal mood and affect.    Pertinent Imaging: Results for MILANNI, AYUB (MRN [id number]) as of 04/05/2020 09:16  Ref. Range 04/05/2020 08:57  Scan Result Unknown 33 ml      Assessment & Plan:    1. Urge incontinence Worsening Patient does not want to try medication, attend physical therapy or have PTNS to treat her incontinence  RTC prn  2. SUI Not bothersome   3. Atrophic vaginitis/Lichen sclerosis Patient does not want another prescription for vaginal estrogen cream or samples She also does not desire referral to gynecology for further evaluation and management RTC prn   Return if symptoms worsen or fail to improve.  These notes generated with voice recognition software. I apologize for typographical errors.  Zara Council, PA-C  [facility] [address]  [place], [place] [address] [phone]

## 2020-04-05 ENCOUNTER — Encounter: Payer: Self-pay | Admitting: Urology

## 2020-04-05 ENCOUNTER — Ambulatory Visit (INDEPENDENT_AMBULATORY_CARE_PROVIDER_SITE_OTHER): Payer: Medicare Other | Admitting: Urology

## 2020-04-05 ENCOUNTER — Other Ambulatory Visit: Payer: Self-pay

## 2020-04-05 VITALS — BP 163/80 | HR 74 | Ht 59.0 in | Wt 135.0 lb

## 2020-04-05 DIAGNOSIS — N393 Stress incontinence (female) (male): Secondary | ICD-10-CM | POA: Diagnosis not present

## 2020-04-05 DIAGNOSIS — N952 Postmenopausal atrophic vaginitis: Secondary | ICD-10-CM | POA: Diagnosis not present

## 2020-04-05 DIAGNOSIS — I6523 Occlusion and stenosis of bilateral carotid arteries: Secondary | ICD-10-CM

## 2020-04-05 DIAGNOSIS — N3941 Urge incontinence: Secondary | ICD-10-CM

## 2020-04-05 LAB — BLADDER SCAN AMB NON-IMAGING: Scan Result: 33

## 2020-07-29 DIAGNOSIS — N904 Leukoplakia of vulva: Secondary | ICD-10-CM | POA: Insufficient documentation

## 2020-11-19 DIAGNOSIS — R2689 Other abnormalities of gait and mobility: Secondary | ICD-10-CM | POA: Insufficient documentation

## 2020-12-13 DIAGNOSIS — I7300 Raynaud's syndrome without gangrene: Secondary | ICD-10-CM | POA: Insufficient documentation

## 2020-12-20 ENCOUNTER — Ambulatory Visit (INDEPENDENT_AMBULATORY_CARE_PROVIDER_SITE_OTHER): Payer: Medicare Other

## 2020-12-20 ENCOUNTER — Other Ambulatory Visit (INDEPENDENT_AMBULATORY_CARE_PROVIDER_SITE_OTHER): Payer: Self-pay | Admitting: Nurse Practitioner

## 2020-12-20 ENCOUNTER — Ambulatory Visit (INDEPENDENT_AMBULATORY_CARE_PROVIDER_SITE_OTHER): Payer: Medicare Other | Admitting: Nurse Practitioner

## 2020-12-20 ENCOUNTER — Encounter (INDEPENDENT_AMBULATORY_CARE_PROVIDER_SITE_OTHER): Payer: Self-pay | Admitting: Nurse Practitioner

## 2020-12-20 ENCOUNTER — Other Ambulatory Visit: Payer: Self-pay

## 2020-12-20 VITALS — BP 180/76 | HR 71 | Resp 16 | Wt 126.8 lb

## 2020-12-20 DIAGNOSIS — I7300 Raynaud's syndrome without gangrene: Secondary | ICD-10-CM | POA: Diagnosis not present

## 2020-12-20 DIAGNOSIS — I6523 Occlusion and stenosis of bilateral carotid arteries: Secondary | ICD-10-CM | POA: Diagnosis not present

## 2020-12-21 NOTE — Progress Notes (Signed)
Subjective:    Patient ID: Jody Wagner, female    DOB: June 14, 1931, 87 y.o.   MRN: [id number] Chief Complaint  Patient presents with  . New Patient (Initial Visit)    Ref [name] raynaud's w/o gangrene    Jody Wagner is an 87 year old female that presents today for discoloration of her bilateral lower extremities.  The left foot tends to be worse.  The patient's daughter noticed the discoloration remotely and was concerned for possible perfusion issues.  She noted that it tends to be worse after she came out of the shower.  The daughter also notes that she has Raynaud's disease.  The patient notes that she does not have any pain in her feet when they become discolored.  She does not have any ulcerations or discomfort.  She denies any issues overall with her feet.  Today noninvasive studies show an ABI 1.19 on the right and 1.17 on the left.  The bilateral TBI 0.65.  The patient has biphasic tibial artery waveforms with good toe waveforms bilaterally.   Review of Systems  Skin: Positive for color change.  All other systems reviewed and are negative.      Objective:   Physical Exam Vitals reviewed.  HENT:     Head: Normocephalic.  Cardiovascular:     Rate and Rhythm: Normal rate and regular rhythm.     Pulses:          Dorsalis pedis pulses are 1+ on the right side and 1+ on the left side.       Posterior tibial pulses are 1+ on the right side and 1+ on the left side.  Pulmonary:     Effort: Pulmonary effort is normal.  Skin:    General: Skin is warm and dry.  Neurological:     Mental Status: She is alert and oriented to person, place, and time.  Psychiatric:        Mood and Affect: Mood normal.        Behavior: Behavior normal.        Thought Content: Thought content normal.        Judgment: Judgment normal.     BP (!) 180/76 (BP Location: Right Arm)   Pulse 71   Resp 16   Wt 126 lb 12.8 oz (57.5 kg)   BMI 25.61 kg/m   Past Medical History:  Diagnosis Date  . Acid  reflux   . Allergic rhinitis   . Amputation finger    as a child  . Anxiety   . Arthritis   . Asthma   . Atrophic vaginitis   . Cancer (HCC)    squamous cell -on cheek  . COPD (chronic obstructive pulmonary disease) ([place])   . Depression   . Dyspnea    with exertion/ occasional wheezing  . Dysuria   . Heart murmur   . Heartburn   . HLD (hyperlipidemia)   . HOH (hard of hearing)   . HTN (hypertension)   . Nocturia   . Obesity   . PONV (postoperative nausea and vomiting)   . Popliteal pain   . Restless leg syndrome   . Rheumatic fever 1953   hx of/ damaged valves/ scared  . RLS (restless legs syndrome)   . Shingles    hx of  . Urge incontinence   . Urinary frequency   . Urinary incontinence   . Urinary urgency   . Wears dentures    upper    Social History  Socioeconomic History  . Marital status: Widowed    Spouse name: Not on file  . Number of children: Not on file  . Years of education: Not on file  . Highest education level: Not on file  Occupational History  . Not on file  Tobacco Use  . Smoking status: Former Smoker    Packs/day: 1.00    Types: Cigarettes  . Smokeless tobacco: Never Used  . Tobacco comment: quit 15years  Substance and Sexual Activity  . Alcohol use: No    Alcohol/week: 0.0 standard drinks  . Drug use: No  . Sexual activity: Not on file  Other Topics Concern  . Not on file  Social History Narrative  . Not on file   Social Determinants of Health   Financial Resource Strain: Not on file  Food Insecurity: Not on file  Transportation Needs: Not on file  Physical Activity: Not on file  Stress: Not on file  Social Connections: Not on file  Intimate Partner Violence: Not on file    Past Surgical History:  Procedure Laterality Date  . arm surgery Left 2013,2014   pulled from socket  . CATARACT EXTRACTION W/PHACO Left 07/10/2017   Procedure: CATARACT EXTRACTION PHACO AND INTRAOCULAR LENS PLACEMENT (IOC) LEFT;  Surgeon: Eulogio Bear, MD;  Location: [facility];  Service: Ophthalmology;  Laterality: Left;  . CATARACT EXTRACTION W/PHACO Right 08/07/2017   Procedure: CATARACT EXTRACTION PHACO AND INTRAOCULAR LENS PLACEMENT ([place]) RIGHT;  Surgeon: Eulogio Bear, MD;  Location: [facility];  Service: Ophthalmology;  Laterality: Right;  . rheumatic fever  1953   damaged valves  . SHOULDER SURGERY Left   . skin cancer removal  08/2015   squamous cell/ MOHS  . WRIST SURGERY Left 2003    Family History  Problem Relation Age of Onset  . Heart disease Other   . Prostate cancer Father   . Prostate cancer Brother   . Lung cancer Sister   . Cancer Sister        intestines  . Kidney cancer Neg Hx   . Bladder Cancer Neg Hx   . Kidney disease Neg Hx   . Breast cancer Neg Hx     Allergies  Allergen Reactions  . Other Swelling    Vomit   "Wild Mushrooms" vomit  . Lovastatin Other (See Comments)  . Latex Itching    Only to bandaids    No flowsheet data found.    CMP  No results found for: NA, K, CL, CO2, GLUCOSE, BUN, CREATININE, CALCIUM, PROT, ALBUMIN, AST, ALT, ALKPHOS, BILITOT, GFRNONAA, GFRAA   VAS [place] ABI WITH/WO TBI  Result Date: 12/20/2020  LOWER EXTREMITY DOPPLER STUDY Patient Name:  Jody Wagner  Date of Exam:   12/20/2020 Medical Rec #: [id number]      Accession #:    [id number] Date of Birth: 1931/04/26      Patient Gender: F Patient Age:   52Y Exam Location:  [facility] Procedure:      VAS [place] ABI WITH/WO TBI Referring Phys: [id number] Lambert --------------------------------------------------------------------------------  Other Factors: Left foot discoloring.  Performing Technologist: Concha Norway RVT  Examination Guidelines: A complete evaluation includes at minimum, Doppler waveform signals and systolic blood pressure reading at the level of bilateral brachial, anterior tibial, and posterior tibial arteries, when vessel segments are accessible. Bilateral  testing is considered an integral part of a complete examination. Photoelectric Plethysmograph (PPG) waveforms and toe systolic pressure readings are  included as required and additional duplex testing as needed. Limited examinations for reoccurring indications may be performed as noted.  ABI Findings: +---------+------------------+-----+--------+--------+ Right    Rt Pressure (mmHg)IndexWaveformComment  +---------+------------------+-----+--------+--------+ Brachial 156                                     +---------+------------------+-----+--------+--------+ ATA      186               1.19 biphasic         +---------+------------------+-----+--------+--------+ PTA      186               1.19 biphasic         +---------+------------------+-----+--------+--------+ Great Toe101               0.65 Normal           +---------+------------------+-----+--------+--------+ +---------+------------------+-----+--------+-------+ Left     Lt Pressure (mmHg)IndexWaveformComment +---------+------------------+-----+--------+-------+ ATA      178               1.14 biphasic        +---------+------------------+-----+--------+-------+ PTA      182               1.17 biphasic        +---------+------------------+-----+--------+-------+ Great Toe102               0.65 Normal          +---------+------------------+-----+--------+-------+  Summary: Right: Resting right ankle-brachial index is within normal range. No evidence of significant right lower extremity arterial disease. The right toe-brachial index is normal. Left: Resting left ankle-brachial index is within normal range. No evidence of significant left lower extremity arterial disease. The left toe-brachial index is normal.  *See table(s) above for measurements and observations.  Electronically signed by Hortencia Pilar MD on 12/20/2020 at 5:16:59 PM.    Final        Assessment & Plan:   1. Raynaud's disease without  gangrene Based on the patient's noninvasive studies as well as her description of symptoms it is likely that this is related to Raynaud's disease and possibly some chronic venous insufficiency.  Overall, the noninvasive studies indicate that there are no limb threatening symptoms currently.  The patient and family are advised about what signs and symptoms to look for that would be concern for limb threatening symptoms such as deep discoloration that does not change with elevation, wounds or ulcerations or pain in the feet.  If any of those were to occur they are to contact our office for further evaluation.  Otherwise, we will continue to monitor the patient with conservative treatment given her age.  We will also evaluate the lower extremities at the patient's follow-up in July.  2. Carotid atherosclerosis, bilateral Carotid disease is stable we will maintain follow-up in July.  Current Outpatient Medications on File Prior to Visit  Medication Sig Dispense Refill  . acetaminophen (TYLENOL) 500 MG tablet Take by mouth.    Marland Kitchen albuterol (VENTOLIN HFA) 108 (90 Base) MCG/ACT inhaler Inhale into the lungs.    Marland Kitchen aspirin EC 81 MG tablet Take by mouth.    . cetirizine (ZYRTEC) 10 MG tablet Take by mouth. Reported on 11/22/2015    . Cholecalciferol (VITAMIN D3) 2000 units capsule Take by mouth.    . clobetasol ointment (TEMOVATE) 3.81 % Apply 1 application topically 2 (two) times a week.    Marland Kitchen  cyanocobalamin 100 MCG tablet Take 100 mcg by mouth daily.    Marland Kitchen donepezil (ARICEPT) 5 MG tablet Take 5 mg by mouth daily.    . fluticasone (FLONASE) 50 MCG/ACT nasal spray Place 2 sprays daily as needed into the nose.     . losartan (COZAAR) 100 MG tablet Take by mouth.    Marland Kitchen omeprazole (PRILOSEC) 20 MG capsule Take 2 (two) times daily by mouth. Am and bedtime    . Polyethyl Glycol-Propyl Glycol (SYSTANE) 0.4-0.3 % GEL ophthalmic gel Place 1 application as needed into both eyes (at bedtime).     Marland Kitchen rOPINIRole (REQUIP)  0.5 MG tablet Take at bedtime by mouth.     . sertraline (ZOLOFT) 50 MG tablet Take at bedtime by mouth.     . triamcinolone cream (KENALOG) 0.1 % Apply topically.     No current facility-administered medications on file prior to visit.    There are no Patient Instructions on file for this visit. No follow-ups on file.   Kris Hartmann, NP

## 2020-12-22 ENCOUNTER — Encounter (INDEPENDENT_AMBULATORY_CARE_PROVIDER_SITE_OTHER): Payer: Self-pay | Admitting: Nurse Practitioner

## 2021-03-03 ENCOUNTER — Ambulatory Visit (INDEPENDENT_AMBULATORY_CARE_PROVIDER_SITE_OTHER): Payer: Medicare Other | Admitting: Vascular Surgery

## 2021-03-03 ENCOUNTER — Other Ambulatory Visit: Payer: Self-pay

## 2021-03-03 ENCOUNTER — Encounter (INDEPENDENT_AMBULATORY_CARE_PROVIDER_SITE_OTHER): Payer: Self-pay

## 2021-03-03 ENCOUNTER — Ambulatory Visit (INDEPENDENT_AMBULATORY_CARE_PROVIDER_SITE_OTHER): Payer: Medicare Other

## 2021-03-03 DIAGNOSIS — I6523 Occlusion and stenosis of bilateral carotid arteries: Secondary | ICD-10-CM | POA: Diagnosis not present

## 2021-05-12 ENCOUNTER — Ambulatory Visit
Admission: EM | Admit: 2021-05-12 | Discharge: 2021-05-12 | Disposition: A | Discharge status: Home / Self Care | Payer: Medicare Other | Attending: Emergency Medicine | Admitting: Emergency Medicine

## 2021-05-12 ENCOUNTER — Other Ambulatory Visit: Payer: Self-pay

## 2021-05-12 DIAGNOSIS — L03032 Cellulitis of left toe: Secondary | ICD-10-CM | POA: Diagnosis not present

## 2021-05-12 MED ORDER — DOXYCYCLINE HYCLATE 100 MG PO CAPS: 100.0000 mg | ORAL_CAPSULE | Freq: Two times a day (BID) | ORAL | 0 refills | Status: AC

## 2021-05-12 NOTE — ED Provider Notes (Signed)
HPI  SUBJECTIVE:  Jody Wagner is a 87 y.o. female who presents with 3 days of erythema, swelling around the nail of her left first great toe after getting a manicure last week.  she reports hypersensitivity.  No erythema, swelling, tenderness at the MCP joint.  No drainage, fevers, body aches, erythema streaking up her foot.  She denies trauma to the toe.  She tried antibiotic ointment with improvement in her symptoms.  Symptoms are worse with wearing shoes and with stubbing it against things.  She has a past medical history of chronic kidney disease stage III, osteoporosis, pretension, hypercholesterolemia.  No history of peripheral neuropathy, diabetes.  PMD: [facility].  She is establishing care at [facility] next week.   Past Medical History:  Diagnosis Date   Acid reflux    Allergic rhinitis    Amputation finger    as a child   Anxiety    Arthritis    Asthma    Atrophic vaginitis    Cancer ([place])    squamous cell -on cheek   COPD (chronic obstructive pulmonary disease) (HCC)    Depression    Dyspnea    with exertion/ occasional wheezing   Dysuria    Heart murmur    Heartburn    HLD (hyperlipidemia)    HOH (hard of hearing)    HTN (hypertension)    Nocturia    Obesity    PONV (postoperative nausea and vomiting)    Popliteal pain    Restless leg syndrome    Rheumatic fever 1953   hx of/ damaged valves/ scared   RLS (restless legs syndrome)    Shingles    hx of   Urge incontinence    Urinary frequency    Urinary incontinence    Urinary urgency    Wears dentures    upper    Past Surgical History:  Procedure Laterality Date   arm surgery Left 2013,2014   pulled from socket   CATARACT EXTRACTION W/PHACO Left 07/10/2017   Procedure: CATARACT EXTRACTION PHACO AND INTRAOCULAR LENS PLACEMENT ([place]) LEFT;  Surgeon: Eulogio Bear, MD;  Location: [facility];  Service: Ophthalmology;  Laterality: Left;   CATARACT EXTRACTION W/PHACO Right 08/07/2017    Procedure: CATARACT EXTRACTION PHACO AND INTRAOCULAR LENS PLACEMENT ([facility]) RIGHT;  Surgeon: Eulogio Bear, MD;  Location: [facility];  Service: Ophthalmology;  Laterality: Right;   rheumatic fever  1953   damaged valves   SHOULDER SURGERY Left    skin cancer removal  08/2015   squamous cell/ MOHS   WRIST SURGERY Left 2003    Family History  Problem Relation Age of Onset   Heart disease Other    Prostate cancer Father    Prostate cancer Brother    Lung cancer Sister    Cancer Sister        intestines   Kidney cancer Neg Hx    Bladder Cancer Neg Hx    Kidney disease Neg Hx    Breast cancer Neg Hx     Social History   Tobacco Use   Smoking status: Former    Packs/day: 1.00    Types: Cigarettes   Smokeless tobacco: Never   Tobacco comments:    quit 15years  Substance Use Topics   Alcohol use: No    Alcohol/week: 0.0 standard drinks   Drug use: No    No current facility-administered medications for this encounter.  Current Outpatient Medications:    doxycycline (VIBRAMYCIN) 100 MG  capsule, Take 1 capsule (100 mg total) by mouth 2 (two) times daily for 7 days., Disp: 14 capsule, Rfl: 0   acetaminophen (TYLENOL) 500 MG tablet, Take by mouth., Disp: , Rfl:    albuterol (VENTOLIN HFA) 108 (90 Base) MCG/ACT inhaler, Inhale into the lungs., Disp: , Rfl:    aspirin EC 81 MG tablet, Take by mouth., Disp: , Rfl:    cetirizine (ZYRTEC) 10 MG tablet, Take by mouth. Reported on 11/22/2015, Disp: , Rfl:    Cholecalciferol (VITAMIN D3) 2000 units capsule, Take by mouth., Disp: , Rfl:    clobetasol ointment (TEMOVATE) [id number]-123456789 %, Apply 1 application topically 2 (two) times a week., Disp: , Rfl:    cyanocobalamin 100 MCG tablet, Take 100 mcg by mouth daily., Disp: , Rfl:    donepezil (ARICEPT) 5 MG tablet, Take 5 mg by mouth daily., Disp: , Rfl:    fluticasone (FLONASE) 50 MCG/ACT nasal spray, Place 2 sprays daily as needed into the nose. , Disp: , Rfl:    losartan (COZAAR)  100 MG tablet, Take by mouth., Disp: , Rfl:    omeprazole (PRILOSEC) 20 MG capsule, Take 2 (two) times daily by mouth. Am and bedtime, Disp: , Rfl:    Polyethyl Glycol-Propyl Glycol (SYSTANE) 0.4-0.3 % GEL ophthalmic gel, Place 1 application as needed into both eyes (at bedtime). , Disp: , Rfl:    rOPINIRole (REQUIP) 0.5 MG tablet, Take at bedtime by mouth. , Disp: , Rfl:    sertraline (ZOLOFT) 50 MG tablet, Take at bedtime by mouth. , Disp: , Rfl:    triamcinolone cream (KENALOG) 0.1 %, Apply topically., Disp: , Rfl:   Allergies  Allergen Reactions   Other Swelling    Vomit   "Wild Mushrooms" vomit   Lovastatin Other (See Comments)   Latex Itching    Only to bandaids     ROS  As noted in HPI.   Physical Exam  BP (!) 143/71 (BP Location: Left Arm)   Pulse 76   Temp 98 F (36.7 C) (Oral)   Resp 18   SpO2 (!) 85%   Constitutional: Well developed, well nourished, no acute distress Eyes:  EOMI, conjunctiva normal bilaterally HENT: Normocephalic, atraumatic,mucus membranes moist Respiratory: Normal inspiratory effort Cardiovascular: Normal rate GI: nondistended skin: No rash, skin intact Musculoskeletal: Mildly tender erythema, edema at the nail fold of the left big toe.  No expressible purulent drainage.  No obvious abscess.  No bony tenderness of the toe.  No swelling of the toe pad.  No swelling, redness of the first MTP    Neurologic: Alert & oriented x 3, no focal neuro deficits Psychiatric: Speech and behavior appropriate   ED Course   Medications - No data to display  No orders of the defined types were placed in this encounter.   No results found for this or any previous visit (from the past 24 hour(s)). No results found.  ED Clinical Impression  1. Paronychia of great toe, left      ED Assessment/Plan  Presentation consistent with a paronychia.  there does not appear to be anything I&D at this time.  Will send home with doxycycline because of her  chronic kidney disease, epsom salt soaks.  Tylenol as needed for pain.  Wear clean white sock to protect the toe.  Return here for any collection of pus and we can drain it at that time.  She is establishing care at [facility] next week.  ER return precautions given to  patient and family member.  Discussed  MDM, treatment plan, and plan for follow-up with family member and patient. Discussed sn/sx that should prompt return to the ED. they agree with plan.   Meds ordered this encounter  Medications   doxycycline (VIBRAMYCIN) 100 MG capsule    Sig: Take 1 capsule (100 mg total) by mouth 2 (two) times daily for 7 days.    Dispense:  14 capsule    Refill:  0       *This clinic note was created using [employer]. Therefore, there may be occasional mistakes despite careful proofreading.  ?    Melynda Ripple, MD 05/12/21 [id number]

## 2021-05-12 NOTE — ED Triage Notes (Signed)
Pt presents today with c/o of redness/swelling to left big toe x 3 days. Denies injury.

## 2021-05-12 NOTE — Discharge Instructions (Addendum)
Keep this clean with soap and water.  Warm Epsom salt soaks will be helpful.  Antibiotic ointment is fine.  May take Tylenol 3-4 times a day as needed for pain.  Finish the antibiotics, even if you feel better.  Return here if you see a collection of pus or if it gets worse, and we can drain it.  There does not appear to be anything to drain today.

## 2022-03-02 ENCOUNTER — Ambulatory Visit (INDEPENDENT_AMBULATORY_CARE_PROVIDER_SITE_OTHER): Payer: Medicare Other | Admitting: Vascular Surgery

## 2022-03-02 ENCOUNTER — Encounter (INDEPENDENT_AMBULATORY_CARE_PROVIDER_SITE_OTHER): Payer: Medicare Other

## 2022-03-10 ENCOUNTER — Other Ambulatory Visit (INDEPENDENT_AMBULATORY_CARE_PROVIDER_SITE_OTHER): Payer: Self-pay | Admitting: Nurse Practitioner

## 2022-03-10 DIAGNOSIS — I6523 Occlusion and stenosis of bilateral carotid arteries: Secondary | ICD-10-CM

## 2022-03-12 NOTE — Progress Notes (Signed)
MRN : [id number]  Jody Wagner is a 88 y.o. ([birth year]) female who presents with chief complaint of check carotid arteries.  History of Present Illness:   The patient is seen for follow up evaluation of carotid stenosis. The carotid stenosis followed by ultrasound.    The patient denies amaurosis fugax. There is no recent history of TIA symptoms or focal motor deficits. There is no prior documented CVA.   The patient is taking enteric-coated aspirin 81 mg daily.   There is no history of migraine headaches. There is no history of seizures.   The patient has a history of coronary artery disease, no recent episodes of angina or shortness of breath. The patient denies PAD or claudication symptoms. There is a history of hyperlipidemia which is being treated with a statin.     Carotid Duplex done today shows RICA 1-54% and LICA 00-86%.  No significant change compared to last study   No outpatient medications have been marked as taking for the 03/13/22 encounter (Appointment) with Delana Meyer, Dolores Lory, MD.    Past Medical History:  Diagnosis Date   Acid reflux    Allergic rhinitis    Amputation finger    as a child   Anxiety    Arthritis    Asthma    Atrophic vaginitis    Cancer ([place])    squamous cell -on cheek   COPD (chronic obstructive pulmonary disease) ([place])    Depression    Dyspnea    with exertion/ occasional wheezing   Dysuria    Heart murmur    Heartburn    HLD (hyperlipidemia)    HOH (hard of hearing)    HTN (hypertension)    Nocturia    Obesity    PONV (postoperative nausea and vomiting)    Popliteal pain    Restless leg syndrome    Rheumatic fever 1953   hx of/ damaged valves/ scared   RLS (restless legs syndrome)    Shingles    hx of   Urge incontinence    Urinary frequency    Urinary incontinence    Urinary urgency    Wears dentures    upper    Past Surgical History:  Procedure Laterality Date   arm surgery Left 2013,2014   pulled from  socket   CATARACT EXTRACTION W/PHACO Left 07/10/2017   Procedure: CATARACT EXTRACTION PHACO AND INTRAOCULAR LENS PLACEMENT ([place]) LEFT;  Surgeon: Eulogio Bear, MD;  Location: [facility];  Service: Ophthalmology;  Laterality: Left;   CATARACT EXTRACTION W/PHACO Right 08/07/2017   Procedure: CATARACT EXTRACTION PHACO AND INTRAOCULAR LENS PLACEMENT ([facility]) RIGHT;  Surgeon: Eulogio Bear, MD;  Location: [facility];  Service: Ophthalmology;  Laterality: Right;   rheumatic fever  1953   damaged valves   SHOULDER SURGERY Left    skin cancer removal  08/2015   squamous cell/ MOHS   WRIST SURGERY Left 2003    Social History Social History   Tobacco Use   Smoking status: Former    Packs/day: 1.00    Types: Cigarettes   Smokeless tobacco: Never   Tobacco comments:    quit 15years  Substance Use Topics   Alcohol use: No    Alcohol/week: 0.0 standard drinks of alcohol   Drug use: No    Family History Family History  Problem Relation Age of Onset   Heart disease Other    Prostate cancer Father    Prostate cancer Brother  Lung cancer Sister    Cancer Sister        intestines   Kidney cancer Neg Hx    Bladder Cancer Neg Hx    Kidney disease Neg Hx    Breast cancer Neg Hx     Allergies  Allergen Reactions   Other Swelling    Vomit   "Wild Mushrooms" vomit   Lovastatin Other (See Comments)   Latex Itching    Only to bandaids     REVIEW OF SYSTEMS (Negative unless checked)  Constitutional: '[]'$ Weight loss  '[]'$ Fever  '[]'$ Chills Cardiac: '[]'$ Chest pain   '[]'$ Chest pressure   '[]'$ Palpitations   '[]'$ Shortness of breath when laying flat   '[]'$ Shortness of breath with exertion. Vascular:  '[x]'$ Pain in legs with walking   '[]'$ Pain in legs at rest  '[]'$ History of DVT   '[]'$ Phlebitis   '[]'$ Swelling in legs   '[]'$ Varicose veins   '[]'$ Non-healing ulcers Pulmonary:   '[]'$ Uses home oxygen   '[]'$ Productive cough   '[]'$ Hemoptysis   '[]'$ Wheeze  '[]'$ COPD   '[]'$ Asthma Neurologic:  '[]'$ Dizziness   '[]'$ Seizures    '[]'$ History of stroke   '[]'$ History of TIA  '[]'$ Aphasia   '[]'$ Vissual changes   '[]'$ Weakness or numbness in arm   '[]'$ Weakness or numbness in leg Musculoskeletal:   '[]'$ Joint swelling   '[]'$ Joint pain   '[]'$ Low back pain Hematologic:  '[]'$ Easy bruising  '[]'$ Easy bleeding   '[]'$ Hypercoagulable state   '[]'$ Anemic Gastrointestinal:  '[]'$ Diarrhea   '[]'$ Vomiting  '[]'$ Gastroesophageal reflux/heartburn   '[]'$ Difficulty swallowing. Genitourinary:  '[]'$ Chronic kidney disease   '[]'$ Difficult urination  '[]'$ Frequent urination   '[]'$ Blood in urine Skin:  '[]'$ Rashes   '[]'$ Ulcers  Psychological:  '[]'$ History of anxiety   '[]'$  History of major depression.  Physical Examination  There were no vitals filed for this visit. There is no height or weight on file to calculate BMI. Gen: WD/WN, NAD Head: [place]/AT, No temporalis wasting.  Ear/Nose/Throat: Hearing grossly intact, nares w/o erythema or drainage Eyes: PER, EOMI, sclera nonicteric.  Neck: Supple, no masses.  No bruit or JVD.  Pulmonary:  Good air movement, no audible wheezing, no use of accessory muscles.  Cardiac: RRR, normal S1, S2, no Murmurs. Vascular:  carotid bruit noted Vessel Right Left  Radial Palpable Palpable  Carotid  Palpable  Palpable  PT  Not Palpable Not Palpable  Gastrointestinal: soft, non-distended. No guarding/no peritoneal signs.  Musculoskeletal: M/S 5/5 throughout.  No visible deformity.  Neurologic: CN 2-12 intact. Pain and light touch intact in extremities.  Symmetrical.  Speech is fluent. Motor exam as listed above. Psychiatric: Judgment intact, Mood & affect appropriate for pt's clinical situation. Dermatologic: No rashes or ulcers noted.  No changes consistent with cellulitis.   CBC No results found for: "WBC", "HGB", "HCT", "MCV", "PLT"  BMET No results found for: "NA", "K", "CL", "CO2", "GLUCOSE", "BUN", "CREATININE", "CALCIUM", "GFRNONAA", "GFRAA" CrCl cannot be calculated (No successful lab value found.).  COAG No results found for: "INR",  "PROTIME"  Radiology No results found.   Assessment/Plan 1. Bilateral carotid artery stenosis Recommend:   Given the patient's asymptomatic subcritical stenosis no further invasive testing or surgery at this time.   Carotid Duplex done today shows RICA <35% and LICA 57-32%.  Mild change of the LICA compared to last study    Continue antiplatelet therapy as prescribed Continue management of CAD, HTN and Hyperlipidemia Healthy heart diet,  encouraged exercise at least 4 times per week Follow up in 12 months with duplex ultrasound and physical  - VAS US CAROTID; Future  2. Raynaud's disease without gangrene Recommend:  The patient is currently tolerating the Raynaud's changes fairly well. Lengthy discussion regarding keeping the feet and the hands warm; gloves, washing with only warm water (especially avoiding cold water immersion) and using wool socks, specifically Smartwool was recommended.  Possibility of using Norvasc was discussed but it was decided to hold off for now until the benefits of conservative therapy is assessed.  The use of Pletal was also reviewed as well as the fact that it is an off label use which is typically reserved for failures of Norvasc and conservative therapy.    3. Essential hypertension Continue antihypertensive medications as already ordered, these medications have been reviewed and there are no changes at this time.   4. Pulmonary emphysema, unspecified emphysema type ([place]) Continue pulmonary medications and aerosols as already ordered, these medications have been reviewed and there are no changes at this time.    5. Mixed hyperlipidemia Continue statin as ordered and reviewed, no changes at this time     Hortencia Pilar, MD  03/12/2022 3:49 PM

## 2022-03-13 ENCOUNTER — Encounter (INDEPENDENT_AMBULATORY_CARE_PROVIDER_SITE_OTHER): Payer: Self-pay | Admitting: Vascular Surgery

## 2022-03-13 ENCOUNTER — Ambulatory Visit (INDEPENDENT_AMBULATORY_CARE_PROVIDER_SITE_OTHER): Payer: Medicare Other

## 2022-03-13 ENCOUNTER — Ambulatory Visit (INDEPENDENT_AMBULATORY_CARE_PROVIDER_SITE_OTHER): Payer: Medicare Other | Admitting: Vascular Surgery

## 2022-03-13 VITALS — BP 161/76 | HR 71 | Resp 16 | Wt 130.8 lb

## 2022-03-13 DIAGNOSIS — J439 Emphysema, unspecified: Secondary | ICD-10-CM | POA: Diagnosis not present

## 2022-03-13 DIAGNOSIS — I7300 Raynaud's syndrome without gangrene: Secondary | ICD-10-CM | POA: Diagnosis not present

## 2022-03-13 DIAGNOSIS — I10 Essential (primary) hypertension: Secondary | ICD-10-CM | POA: Diagnosis not present

## 2022-03-13 DIAGNOSIS — I6523 Occlusion and stenosis of bilateral carotid arteries: Secondary | ICD-10-CM | POA: Diagnosis not present

## 2022-03-13 DIAGNOSIS — E782 Mixed hyperlipidemia: Secondary | ICD-10-CM

## 2022-03-19 ENCOUNTER — Encounter (INDEPENDENT_AMBULATORY_CARE_PROVIDER_SITE_OTHER): Payer: Self-pay | Admitting: Vascular Surgery

## 2022-06-26 ENCOUNTER — Encounter (INDEPENDENT_AMBULATORY_CARE_PROVIDER_SITE_OTHER): Payer: Self-pay

## 2023-03-19 ENCOUNTER — Encounter (INDEPENDENT_AMBULATORY_CARE_PROVIDER_SITE_OTHER): Payer: Self-pay | Admitting: Vascular Surgery

## 2023-03-19 ENCOUNTER — Ambulatory Visit (INDEPENDENT_AMBULATORY_CARE_PROVIDER_SITE_OTHER): Payer: Medicare Other

## 2023-03-19 ENCOUNTER — Ambulatory Visit (INDEPENDENT_AMBULATORY_CARE_PROVIDER_SITE_OTHER): Payer: Medicare Other | Admitting: Vascular Surgery

## 2023-03-19 VITALS — BP 143/74 | HR 81 | Resp 18 | Ht 59.0 in | Wt 126.8 lb

## 2023-03-19 DIAGNOSIS — I6523 Occlusion and stenosis of bilateral carotid arteries: Secondary | ICD-10-CM

## 2023-03-19 DIAGNOSIS — I10 Essential (primary) hypertension: Secondary | ICD-10-CM

## 2023-03-19 DIAGNOSIS — J439 Emphysema, unspecified: Secondary | ICD-10-CM

## 2023-03-19 DIAGNOSIS — E782 Mixed hyperlipidemia: Secondary | ICD-10-CM | POA: Diagnosis not present

## 2023-03-19 NOTE — Progress Notes (Signed)
MRN : [id number]  Jody Wagner is a 89 y.o. (1930-09-28) female who presents with chief complaint of check carotid arteries.  History of Present Illness:   The patient is seen for follow up evaluation of carotid stenosis. The carotid stenosis followed by ultrasound.    The patient denies amaurosis fugax. There is no recent history of TIA symptoms or focal motor deficits. There is no prior documented CVA.   The patient is taking enteric-coated aspirin 81 mg daily.   There is no history of migraine headaches. There is no history of seizures.   The patient has a history of coronary artery disease, no recent episodes of angina or shortness of breath. The patient denies PAD or claudication symptoms. There is a history of hyperlipidemia which is being treated with a statin.     Carotid Duplex done today shows RICA 1-39% and LICA 40-59%.  No significant change compared to last study   Current Meds  Medication Sig   acetaminophen (TYLENOL) 500 MG tablet Take by mouth.   albuterol (VENTOLIN HFA) 108 (90 Base) MCG/ACT inhaler Inhale into the lungs.   aspirin EC 81 MG tablet Take by mouth.   cetirizine (ZYRTEC) 10 MG tablet Take by mouth. Reported on 11/22/2015   Cholecalciferol (VITAMIN D3) 2000 units capsule Take by mouth.   clobetasol ointment (TEMOVATE) 0.05 % Apply 1 application topically 2 (two) times a week.   denosumab (PROLIA) 60 MG/ML SOSY injection Inject 60 mg into the skin every 6 (six) months.   fluticasone (FLONASE) 50 MCG/ACT nasal spray Place 2 sprays daily as needed into the nose.    losartan (COZAAR) 100 MG tablet Take by mouth.   omeprazole (PRILOSEC) 20 MG capsule Take 2 (two) times daily by mouth. Am and bedtime   Propylene Glycol (SYSTANE BALANCE) 0.6 % SOLN Apply 1 drop to eye as needed.   rOPINIRole (REQUIP) 0.5 MG tablet Take by mouth at bedtime.   rosuvastatin (CRESTOR) 5 MG tablet Take 5 mg by mouth daily.   sertraline (ZOLOFT) 50 MG  tablet Take at bedtime by mouth.    triamcinolone cream (KENALOG) 0.1 % Apply topically.    Past Medical History:  Diagnosis Date   Acid reflux    Allergic rhinitis    Amputation finger    as a child   Anxiety    Arthritis    Asthma    Atrophic vaginitis    Cancer ([facility])    squamous cell -on cheek   COPD (chronic obstructive pulmonary disease) ([facility])    Depression    Dyspnea    with exertion/ occasional wheezing   Dysuria    Heart murmur    Heartburn    HLD (hyperlipidemia)    HOH (hard of hearing)    HTN (hypertension)    Nocturia    Obesity    PONV (postoperative nausea and vomiting)    Popliteal pain    Restless leg syndrome    Rheumatic fever 1953   hx of/ damaged valves/ scared   RLS (restless legs syndrome)    Shingles    hx of   Urge incontinence    Urinary frequency    Urinary incontinence    Urinary urgency    Wears dentures    upper    Past Surgical History:  Procedure Laterality Date   arm surgery Left 2013,2014  pulled from socket   CATARACT EXTRACTION W/PHACO Left 07/10/2017   Procedure: CATARACT EXTRACTION PHACO AND INTRAOCULAR LENS PLACEMENT ([facility]) LEFT;  Surgeon: Nevada Crane, MD;  [facility]: [facility];  Service: Ophthalmology;  Laterality: Left;   CATARACT EXTRACTION W/PHACO Right 08/07/2017   Procedure: CATARACT EXTRACTION PHACO AND INTRAOCULAR LENS PLACEMENT ([facility]) RIGHT;  Surgeon: Nevada Crane, MD;  [facility];  Service: Ophthalmology;  Laterality: Right;   rheumatic fever  1953   damaged valves   SHOULDER SURGERY Left    SKIN CANCER DESTRUCTION     skin cancer removal  08/2015   squamous cell/ MOHS   WRIST SURGERY Left 2003    Social History Social History   Tobacco Use   Smoking status: Former    Current packs/day: 1.00    Types: Cigarettes   Smokeless tobacco: Never   Tobacco comments:    quit 15years  Substance Use Topics   Alcohol use: No    Alcohol/week: 0.0 standard drinks of  alcohol   Drug use: No    Family History Family History  Problem Relation Age of Onset   Heart disease Other    Prostate cancer Father    Prostate cancer Brother    Lung cancer Sister    Cancer Sister        intestines   Kidney cancer Neg Hx    Bladder Cancer Neg Hx    Kidney disease Neg Hx    Breast cancer Neg Hx     Allergies  Allergen Reactions   Capsaicin Hives   Other Swelling    Vomit   "Wild Mushrooms" vomit   Lovastatin Other (See Comments)   Latex Itching    Only to bandaids     REVIEW OF SYSTEMS (Negative unless checked)  Constitutional: [] Weight loss  [] Fever  [] Chills Cardiac: [] Chest pain   [] Chest pressure   [] Palpitations   [] Shortness of breath when laying flat   [] Shortness of breath with exertion. Vascular:  [x] Pain in legs with walking   [] Pain in legs at rest  [] History of DVT   [] Phlebitis   [] Swelling in legs   [] Varicose veins   [] Non-healing ulcers Pulmonary:   [] Uses home oxygen   [] Productive cough   [] Hemoptysis   [] Wheeze  [] COPD   [] Asthma Neurologic:  [] Dizziness   [] Seizures   [] History of stroke   [] History of TIA  [] Aphasia   [] Vissual changes   [] Weakness or numbness in arm   [] Weakness or numbness in leg Musculoskeletal:   [] Joint swelling   [] Joint pain   [] Low back pain Hematologic:  [] Easy bruising  [] Easy bleeding   [] Hypercoagulable state   [] Anemic Gastrointestinal:  [] Diarrhea   [] Vomiting  [] Gastroesophageal reflux/heartburn   [] Difficulty swallowing. Genitourinary:  [] Chronic kidney disease   [] Difficult urination  [] Frequent urination   [] Blood in urine Skin:  [] Rashes   [] Ulcers  Psychological:  [] History of anxiety   []  History of major depression.  Physical Examination  Vitals:   03/19/23 1050  BP: (!) 143/74  Pulse: 81  Resp: 18  Weight: 126 lb 12.8 oz (57.5 kg)  Height: 4\' 11"  (1.499 m)   Body mass index is 25.61 kg/m. Gen: WD/WN, NAD Head: [place]/AT, No temporalis wasting.  Ear/Nose/Throat: Hearing grossly  intact, nares w/o erythema or drainage Eyes: PER, EOMI, sclera nonicteric.  Neck: Supple, no masses.  No bruit or JVD.  Pulmonary:  Good air movement, no audible wheezing, no use of accessory muscles.  Cardiac:  RRR, normal S1, S2, no Murmurs. Vascular:  carotid bruit noted Vessel Right Left  Radial Palpable Palpable  Carotid  Palpable  Palpable  Subclav  Palpable Palpable  Gastrointestinal: soft, non-distended. No guarding/no peritoneal signs.  Musculoskeletal: M/S 5/5 throughout.  No visible deformity.  Neurologic: CN 2-12 intact. Pain and light touch intact in extremities.  Symmetrical.  Speech is fluent. Motor exam as listed above. Psychiatric: Judgment intact, Mood & affect appropriate for pt's clinical situation. Dermatologic: No rashes or ulcers noted.  No changes consistent with cellulitis.   CBC No results found for: "WBC", "HGB", "HCT", "MCV", "PLT"  BMET No results found for: "NA", "K", "CL", "CO2", "GLUCOSE", "BUN", "CREATININE", "CALCIUM", "GFRNONAA", "GFRAA" CrCl cannot be calculated (No successful lab value found.).  COAG No results found for: "INR", "PROTIME"  Radiology No results found.   Assessment/Plan 1. Carotid atherosclerosis, bilateral Recommend:   Given the patient's asymptomatic subcritical stenosis no further invasive testing or surgery at this time.   Carotid Duplex done today shows RICA <40% and LICA 40-59%.  Mild change of the LICA compared to last study    Continue antiplatelet therapy as prescribed Continue management of CAD, HTN and Hyperlipidemia Healthy heart diet,  encouraged exercise at least 4 times per week Follow up in 12 months with duplex ultrasound and physical  - VAS US CAROTID; Future  2. Essential hypertension Continue antihypertensive medications as already ordered, these medications have been reviewed and there are no changes at this time.  3. Pulmonary emphysema, unspecified emphysema type ([facility]) Continue pulmonary  medications and aerosols as already ordered, these medications have been reviewed and there are no changes at this time.   4. Mixed hyperlipidemia Continue statin as ordered and reviewed, no changes at this time    Levora Dredge, MD  03/19/2023 11:16 AM

## 2024-01-15 ENCOUNTER — Encounter (INDEPENDENT_AMBULATORY_CARE_PROVIDER_SITE_OTHER): Payer: Self-pay

## 2024-03-13 ENCOUNTER — Encounter (INDEPENDENT_AMBULATORY_CARE_PROVIDER_SITE_OTHER): Payer: Self-pay | Admitting: Vascular Surgery

## 2024-03-13 ENCOUNTER — Ambulatory Visit (INDEPENDENT_AMBULATORY_CARE_PROVIDER_SITE_OTHER): Payer: Medicare Other | Admitting: Vascular Surgery

## 2024-03-13 ENCOUNTER — Ambulatory Visit (INDEPENDENT_AMBULATORY_CARE_PROVIDER_SITE_OTHER): Payer: Medicare Other

## 2024-03-13 VITALS — BP 131/83 | HR 104 | Ht 59.0 in | Wt 120.5 lb

## 2024-03-13 DIAGNOSIS — E782 Mixed hyperlipidemia: Secondary | ICD-10-CM

## 2024-03-13 DIAGNOSIS — I6523 Occlusion and stenosis of bilateral carotid arteries: Secondary | ICD-10-CM | POA: Diagnosis not present

## 2024-03-13 DIAGNOSIS — I10 Essential (primary) hypertension: Secondary | ICD-10-CM | POA: Diagnosis not present

## 2024-03-13 DIAGNOSIS — J439 Emphysema, unspecified: Secondary | ICD-10-CM

## 2024-03-15 ENCOUNTER — Encounter (INDEPENDENT_AMBULATORY_CARE_PROVIDER_SITE_OTHER): Payer: Self-pay | Admitting: Vascular Surgery

## 2024-03-15 NOTE — Progress Notes (Signed)
 MRN : [id number]  Jody Wagner is a 89 y.o. (08-Jul-1931) female who presents with chief complaint of check carotid arteries.  History of Present Illness:  The patient is seen for follow up evaluation of carotid stenosis. The carotid stenosis followed by ultrasound.    The patient denies amaurosis fugax. There is no recent history of TIA symptoms or focal motor deficits. There is no prior documented CVA.   The patient is taking enteric-coated aspirin 81 mg daily.   There is no history of migraine headaches. There is no history of seizures.   The patient has a history of coronary artery disease, no recent episodes of angina or shortness of breath. The patient denies PAD or claudication symptoms. There is a history of hyperlipidemia which is being treated with a statin.     Carotid Duplex done today shows RICA 1-39% and LICA 60-79%.  Slight increase in the left internal carotid artery disease compared to last study   Non Invasive Vascular Testing done at [facility] 10/2023: BLE arterial duplex (11/15/2023): Evidence of left runoff obstruction. ABIs of 0.97 R and 0.99 L. Great toe pressures of 84 mmHg R and 62 mmHg L.   No outpatient medications have been marked as taking for the 03/13/24 encounter (Office Visit) with Jama, Cordella MATSU, MD.    Past Medical History:  Diagnosis Date   Acid reflux    Allergic rhinitis    Amputation finger    as a child   Anxiety    Arthritis    Asthma    Atrophic vaginitis    Cancer ([facility])    squamous cell -on cheek   COPD (chronic obstructive pulmonary disease) ([facility])    Depression    Dyspnea    with exertion/ occasional wheezing   Dysuria    Heart murmur    Heartburn    HLD (hyperlipidemia)    HOH (hard of hearing)    HTN (hypertension)    Nocturia    Obesity    PONV (postoperative nausea and vomiting)    Popliteal pain    Restless leg syndrome    Rheumatic fever 1953   hx of/ damaged valves/ scared   RLS (restless  legs syndrome)    Shingles    hx of   Urge incontinence    Urinary frequency    Urinary incontinence    Urinary urgency    Wears dentures    upper    Past Surgical History:  Procedure Laterality Date   arm surgery Left 2013,2014   pulled from socket   CATARACT EXTRACTION W/PHACO Left 07/10/2017   Procedure: CATARACT EXTRACTION PHACO AND INTRAOCULAR LENS PLACEMENT ([facility]) LEFT;  Surgeon: Myrna Adine Anes, MD;  Location: [facility];  Service: Ophthalmology;  Laterality: Left;   CATARACT EXTRACTION W/PHACO Right 08/07/2017   Procedure: CATARACT EXTRACTION PHACO AND INTRAOCULAR LENS PLACEMENT ([facility]) RIGHT;  Surgeon: Myrna Adine Anes, MD;  Location: [facility];  Service: Ophthalmology;  Laterality: Right;   rheumatic fever  1953   damaged valves   SHOULDER SURGERY Left    SKIN CANCER DESTRUCTION     skin cancer removal  08/2015   squamous cell/ MOHS   WRIST SURGERY Left 2003    Social History Social History   Tobacco Use   Smoking status: Former    Current packs/day: 1.00    Types:  Cigarettes   Smokeless tobacco: Never   Tobacco comments:    quit 15years  Substance Use Topics   Alcohol use: No    Alcohol/week: 0.0 standard drinks of alcohol   Drug use: No    Family History Family History  Problem Relation Age of Onset   Heart disease Other    Prostate cancer Father    Prostate cancer Brother    Lung cancer Sister    Cancer Sister        intestines   Kidney cancer Neg Hx    Bladder Cancer Neg Hx    Kidney disease Neg Hx    Breast cancer Neg Hx     Allergies  Allergen Reactions   Capsaicin Hives   Other Swelling    Vomit   Wild Mushrooms vomit   Lovastatin Other (See Comments)   Latex Itching    Only to bandaids     REVIEW OF SYSTEMS (Negative unless checked)  Constitutional: [] Weight loss  [] Fever  [] Chills Cardiac: [] Chest pain   [] Chest pressure   [] Palpitations   [] Shortness of breath when laying flat   [] Shortness of breath  with exertion. Vascular:  [x] Pain in legs with walking   [] Pain in legs at rest  [] History of DVT   [] Phlebitis   [] Swelling in legs   [] Varicose veins   [] Non-healing ulcers Pulmonary:   [] Uses home oxygen   [] Productive cough   [] Hemoptysis   [] Wheeze  [] COPD   [] Asthma Neurologic:  [] Dizziness   [] Seizures   [] History of stroke   [] History of TIA  [] Aphasia   [] Vissual changes   [] Weakness or numbness in arm   [] Weakness or numbness in leg Musculoskeletal:   [] Joint swelling   [] Joint pain   [] Low back pain Hematologic:  [] Easy bruising  [] Easy bleeding   [] Hypercoagulable state   [] Anemic Gastrointestinal:  [] Diarrhea   [] Vomiting  [] Gastroesophageal reflux/heartburn   [] Difficulty swallowing. Genitourinary:  [] Chronic kidney disease   [] Difficult urination  [] Frequent urination   [] Blood in urine Skin:  [] Rashes   [] Ulcers  Psychological:  [] History of anxiety   []  History of major depression.  Physical Examination  Vitals:   03/13/24 1535  BP: 131/83  Pulse: (!) 104  Weight: 120 lb 8 oz (54.7 kg)  Height: 4' 11 (1.499 m)   Body mass index is 24.34 kg/m. Gen: WD/WN, NAD Head: [place]/AT, No temporalis wasting.  Ear/Nose/Throat: Hearing grossly intact, nares w/o erythema or drainage Eyes: PER, EOMI, sclera nonicteric.  Neck: Supple, no masses.  No bruit or JVD.  Pulmonary:  Good air movement, no audible wheezing, no use of accessory muscles.  Cardiac: RRR, normal S1, S2, no Murmurs. Vascular:  carotid bruit noted Vessel Right Left  Radial Palpable Palpable  Carotid  Palpable  Palpable  Subclav  Palpable Palpable  Gastrointestinal: soft, non-distended. No guarding/no peritoneal signs.  Musculoskeletal: M/S 5/5 throughout.  No visible deformity.  Neurologic: CN 2-12 intact. Pain and light touch intact in extremities.  Symmetrical.  Speech is fluent. Motor exam as listed above. Psychiatric: Judgment intact, Mood & affect appropriate for pt's clinical situation. Dermatologic: No  rashes or ulcers noted.  No changes consistent with cellulitis.   CBC No results found for: WBC, HGB, HCT, MCV, PLT  BMET No results found for: NA, K, CL, CO2, GLUCOSE, BUN, CREATININE, CALCIUM, GFRNONAA, GFRAA CrCl cannot be calculated (No successful lab value found.).  COAG No results found for: INR, PROTIME  Radiology No results found.   Assessment/Plan  1. Bilateral carotid artery stenosis ([facility]) Recommend:   Given the patient's asymptomatic subcritical stenosis no further invasive testing or surgery at this time.   Carotid Duplex done today shows RICA <40% and LICA 60-79%.  Mild change of the LICA compared to last study    Continue antiplatelet therapy as prescribed Continue management of CAD, HTN and Hyperlipidemia Healthy heart diet,  encouraged exercise at least 4 times per week Follow up in 12 months with duplex ultrasound and physical  - VAS US  CAROTID; Future  2. Essential hypertension Continue antihypertensive medications as already ordered, these medications have been reviewed and there are no changes at this time.  3. Pulmonary emphysema, unspecified emphysema type ([facility]) Continue pulmonary medications and aerosols as already ordered, these medications have been reviewed and there are no changes at this time.   4. Mixed hyperlipidemia Continue statin as ordered and reviewed, no changes at this time    Cordella Shawl, MD  03/15/2024 11:42 AM

## 2025-03-12 ENCOUNTER — Encounter (INDEPENDENT_AMBULATORY_CARE_PROVIDER_SITE_OTHER)

## 2025-03-12 ENCOUNTER — Ambulatory Visit (INDEPENDENT_AMBULATORY_CARE_PROVIDER_SITE_OTHER): Admitting: Vascular Surgery
